# Patient Record
Sex: Female | Born: 1940 | Race: White | Hispanic: No | Marital: Married | Smoking: Former smoker
Health system: Southern US, Community
[De-identification: ages and names within clinical notes are randomized; demographics above are authoritative.]

## PROBLEM LIST (undated history)

## (undated) DIAGNOSIS — E785 Hyperlipidemia, unspecified: Secondary | ICD-10-CM

## (undated) DIAGNOSIS — B029 Zoster without complications: Secondary | ICD-10-CM

## (undated) DIAGNOSIS — IMO0002 Reserved for concepts with insufficient information to code with codable children: Secondary | ICD-10-CM

## (undated) DIAGNOSIS — E079 Disorder of thyroid, unspecified: Secondary | ICD-10-CM

## (undated) DIAGNOSIS — Z8619 Personal history of other infectious and parasitic diseases: Secondary | ICD-10-CM

## (undated) DIAGNOSIS — M329 Systemic lupus erythematosus, unspecified: Secondary | ICD-10-CM

## (undated) DIAGNOSIS — B019 Varicella without complication: Secondary | ICD-10-CM

## (undated) DIAGNOSIS — I639 Cerebral infarction, unspecified: Secondary | ICD-10-CM

## (undated) DIAGNOSIS — R918 Other nonspecific abnormal finding of lung field: Secondary | ICD-10-CM

## (undated) DIAGNOSIS — I10 Essential (primary) hypertension: Secondary | ICD-10-CM

## (undated) DIAGNOSIS — M1990 Unspecified osteoarthritis, unspecified site: Secondary | ICD-10-CM

## (undated) HISTORY — DX: Varicella without complication: B01.9

## (undated) HISTORY — DX: Reserved for concepts with insufficient information to code with codable children: IMO0002

## (undated) HISTORY — DX: Zoster without complications: B02.9

## (undated) HISTORY — DX: Hyperlipidemia, unspecified: E78.5

## (undated) HISTORY — PX: TONSILLECTOMY AND ADENOIDECTOMY: SUR1326

## (undated) HISTORY — DX: Essential (primary) hypertension: I10

## (undated) HISTORY — PX: OTHER SURGICAL HISTORY: SHX169

## (undated) HISTORY — DX: Personal history of other infectious and parasitic diseases: Z86.19

## (undated) HISTORY — DX: Cerebral infarction, unspecified: I63.9

## (undated) HISTORY — PX: ABDOMINAL HYSTERECTOMY: SHX81

## (undated) HISTORY — DX: Unspecified osteoarthritis, unspecified site: M19.90

## (undated) HISTORY — DX: Systemic lupus erythematosus, unspecified: M32.9

## (undated) HISTORY — DX: Disorder of thyroid, unspecified: E07.9

---

## 1997-08-31 ENCOUNTER — Ambulatory Visit (HOSPITAL_COMMUNITY): Admission: RE | Admit: 1997-08-31 | Discharge: 1997-08-31 | Payer: Self-pay | Admitting: Internal Medicine

## 1998-08-23 ENCOUNTER — Ambulatory Visit (HOSPITAL_COMMUNITY): Admission: RE | Admit: 1998-08-23 | Discharge: 1998-08-23 | Payer: Self-pay | Admitting: Internal Medicine

## 1999-09-10 ENCOUNTER — Ambulatory Visit (HOSPITAL_COMMUNITY): Admission: RE | Admit: 1999-09-10 | Discharge: 1999-09-10 | Payer: Self-pay | Admitting: Internal Medicine

## 1999-09-10 ENCOUNTER — Encounter: Payer: Self-pay | Admitting: Internal Medicine

## 1999-11-14 ENCOUNTER — Other Ambulatory Visit: Admission: RE | Admit: 1999-11-14 | Discharge: 1999-11-14 | Payer: Self-pay | Admitting: Internal Medicine

## 2000-09-10 ENCOUNTER — Encounter: Payer: Self-pay | Admitting: Internal Medicine

## 2000-09-10 ENCOUNTER — Ambulatory Visit (HOSPITAL_COMMUNITY): Admission: RE | Admit: 2000-09-10 | Discharge: 2000-09-10 | Payer: Self-pay | Admitting: Internal Medicine

## 2001-09-21 ENCOUNTER — Ambulatory Visit (HOSPITAL_COMMUNITY): Admission: RE | Admit: 2001-09-21 | Discharge: 2001-09-21 | Payer: Self-pay | Admitting: Internal Medicine

## 2001-09-21 ENCOUNTER — Encounter: Payer: Self-pay | Admitting: Internal Medicine

## 2002-01-13 ENCOUNTER — Other Ambulatory Visit: Admission: RE | Admit: 2002-01-13 | Discharge: 2002-01-13 | Payer: Self-pay | Admitting: Internal Medicine

## 2002-10-04 ENCOUNTER — Encounter: Payer: Self-pay | Admitting: Internal Medicine

## 2002-10-04 ENCOUNTER — Ambulatory Visit (HOSPITAL_COMMUNITY): Admission: RE | Admit: 2002-10-04 | Discharge: 2002-10-04 | Payer: Self-pay | Admitting: Internal Medicine

## 2003-11-15 ENCOUNTER — Ambulatory Visit (HOSPITAL_COMMUNITY): Admission: RE | Admit: 2003-11-15 | Discharge: 2003-11-15 | Payer: Self-pay | Admitting: Internal Medicine

## 2004-04-22 ENCOUNTER — Ambulatory Visit: Payer: Self-pay | Admitting: Internal Medicine

## 2004-07-31 ENCOUNTER — Ambulatory Visit: Payer: Self-pay | Admitting: Internal Medicine

## 2004-10-14 ENCOUNTER — Ambulatory Visit: Payer: Self-pay | Admitting: Internal Medicine

## 2004-12-04 ENCOUNTER — Ambulatory Visit (HOSPITAL_COMMUNITY): Admission: RE | Admit: 2004-12-04 | Discharge: 2004-12-04 | Payer: Self-pay | Admitting: Internal Medicine

## 2004-12-16 ENCOUNTER — Ambulatory Visit: Payer: Self-pay | Admitting: Internal Medicine

## 2005-06-19 ENCOUNTER — Encounter: Payer: Self-pay | Admitting: Internal Medicine

## 2005-06-19 ENCOUNTER — Other Ambulatory Visit: Admission: RE | Admit: 2005-06-19 | Discharge: 2005-06-19 | Payer: Self-pay | Admitting: Neurosurgery

## 2005-06-19 ENCOUNTER — Ambulatory Visit: Payer: Self-pay | Admitting: Internal Medicine

## 2005-07-09 ENCOUNTER — Encounter: Admission: RE | Admit: 2005-07-09 | Discharge: 2005-07-09 | Payer: Self-pay | Admitting: Internal Medicine

## 2005-07-22 ENCOUNTER — Ambulatory Visit: Payer: Self-pay | Admitting: Internal Medicine

## 2005-07-24 ENCOUNTER — Ambulatory Visit: Payer: Self-pay | Admitting: Internal Medicine

## 2005-12-04 ENCOUNTER — Ambulatory Visit: Payer: Self-pay | Admitting: Internal Medicine

## 2005-12-04 ENCOUNTER — Ambulatory Visit (HOSPITAL_COMMUNITY): Admission: RE | Admit: 2005-12-04 | Discharge: 2005-12-04 | Payer: Self-pay | Admitting: Internal Medicine

## 2006-01-29 ENCOUNTER — Ambulatory Visit: Payer: Self-pay | Admitting: Internal Medicine

## 2006-02-10 ENCOUNTER — Ambulatory Visit: Payer: Self-pay | Admitting: Internal Medicine

## 2006-03-02 ENCOUNTER — Ambulatory Visit: Payer: Self-pay | Admitting: Internal Medicine

## 2006-03-02 LAB — CONVERTED CEMR LAB
ALT: 17 units/L (ref 0–40)
AST: 23 units/L (ref 0–37)
Albumin: 3.5 g/dL (ref 3.5–5.2)
BUN: 10 mg/dL (ref 6–23)
Basophils Absolute: 0 10*3/uL (ref 0.0–0.1)
Basophils Relative: 0.7 % (ref 0.0–1.0)
GFR calc Af Amer: 107 mL/min
GFR calc non Af Amer: 89 mL/min
HCT: 38.2 % (ref 36.0–46.0)
Hemoglobin: 13.1 g/dL (ref 12.0–15.0)
MCHC: 34.4 g/dL (ref 30.0–36.0)
Monocytes Absolute: 0.5 10*3/uL (ref 0.2–0.7)
Monocytes Relative: 9.1 % (ref 3.0–11.0)
Potassium: 4.2 meq/L (ref 3.5–5.1)
RBC: 3.82 M/uL — ABNORMAL LOW (ref 3.87–5.11)
RDW: 13.8 % (ref 11.5–14.6)
Sodium: 137 meq/L (ref 135–145)
Total Bilirubin: 0.4 mg/dL (ref 0.3–1.2)
Total Protein: 6.6 g/dL (ref 6.0–8.3)

## 2006-06-11 ENCOUNTER — Ambulatory Visit: Payer: Self-pay | Admitting: Internal Medicine

## 2006-06-11 LAB — CONVERTED CEMR LAB
Albumin: 3.5 g/dL (ref 3.5–5.2)
Basophils Absolute: 0 10*3/uL (ref 0.0–0.1)
Basophils Relative: 0.9 % (ref 0.0–1.0)
Eosinophils Absolute: 0.1 10*3/uL (ref 0.0–0.6)
Hemoglobin: 13.4 g/dL (ref 12.0–15.0)
Lymphocytes Relative: 27.7 % (ref 12.0–46.0)
MCHC: 34.5 g/dL (ref 30.0–36.0)
MCV: 99.2 fL (ref 78.0–100.0)
Monocytes Absolute: 0.5 10*3/uL (ref 0.2–0.7)
Monocytes Relative: 9.5 % (ref 3.0–11.0)
Neutro Abs: 3.2 10*3/uL (ref 1.4–7.7)
Platelets: 132 10*3/uL — ABNORMAL LOW (ref 150–400)
Total Bilirubin: 0.8 mg/dL (ref 0.3–1.2)

## 2006-08-19 ENCOUNTER — Ambulatory Visit: Payer: Self-pay | Admitting: Internal Medicine

## 2006-08-20 ENCOUNTER — Telehealth: Payer: Self-pay | Admitting: *Deleted

## 2006-08-31 ENCOUNTER — Encounter: Payer: Self-pay | Admitting: Internal Medicine

## 2006-12-07 ENCOUNTER — Ambulatory Visit: Payer: Self-pay | Admitting: Internal Medicine

## 2006-12-07 ENCOUNTER — Telehealth: Payer: Self-pay | Admitting: *Deleted

## 2006-12-07 DIAGNOSIS — M069 Rheumatoid arthritis, unspecified: Secondary | ICD-10-CM | POA: Insufficient documentation

## 2006-12-07 DIAGNOSIS — R930 Abnormal findings on diagnostic imaging of skull and head, not elsewhere classified: Secondary | ICD-10-CM

## 2006-12-07 DIAGNOSIS — Z87898 Personal history of other specified conditions: Secondary | ICD-10-CM

## 2006-12-07 DIAGNOSIS — J449 Chronic obstructive pulmonary disease, unspecified: Secondary | ICD-10-CM

## 2006-12-07 DIAGNOSIS — M810 Age-related osteoporosis without current pathological fracture: Secondary | ICD-10-CM | POA: Insufficient documentation

## 2006-12-07 DIAGNOSIS — E039 Hypothyroidism, unspecified: Secondary | ICD-10-CM | POA: Insufficient documentation

## 2006-12-07 DIAGNOSIS — J4489 Other specified chronic obstructive pulmonary disease: Secondary | ICD-10-CM | POA: Insufficient documentation

## 2006-12-07 DIAGNOSIS — F329 Major depressive disorder, single episode, unspecified: Secondary | ICD-10-CM

## 2006-12-07 DIAGNOSIS — K219 Gastro-esophageal reflux disease without esophagitis: Secondary | ICD-10-CM

## 2006-12-09 ENCOUNTER — Ambulatory Visit: Payer: Self-pay | Admitting: Cardiology

## 2006-12-16 ENCOUNTER — Ambulatory Visit: Payer: Self-pay | Admitting: Internal Medicine

## 2007-01-04 ENCOUNTER — Ambulatory Visit: Payer: Self-pay | Admitting: Internal Medicine

## 2007-01-04 LAB — CONVERTED CEMR LAB
ALT: 21 units/L (ref 0–35)
AST: 22 units/L (ref 0–37)
Basophils Absolute: 0 10*3/uL (ref 0.0–0.1)
Bilirubin, Direct: 0.1 mg/dL (ref 0.0–0.3)
Eosinophils Relative: 1.3 % (ref 0.0–5.0)
HCT: 38.8 % (ref 36.0–46.0)
Hemoglobin: 13.4 g/dL (ref 12.0–15.0)
MCHC: 34.6 g/dL (ref 30.0–36.0)
MCV: 100.2 fL — ABNORMAL HIGH (ref 78.0–100.0)
Monocytes Absolute: 0.6 10*3/uL (ref 0.2–0.7)
Neutrophils Relative %: 55.3 % (ref 43.0–77.0)
RBC: 3.87 M/uL (ref 3.87–5.11)
RDW: 13.3 % (ref 11.5–14.6)
TSH: 3.09 microintl units/mL (ref 0.35–5.50)
Total Protein: 7 g/dL (ref 6.0–8.3)
WBC: 5.4 10*3/uL (ref 4.5–10.5)

## 2007-01-26 ENCOUNTER — Ambulatory Visit (HOSPITAL_COMMUNITY): Admission: RE | Admit: 2007-01-26 | Discharge: 2007-01-26 | Payer: Self-pay | Admitting: Internal Medicine

## 2007-03-03 ENCOUNTER — Ambulatory Visit: Payer: Self-pay | Admitting: Internal Medicine

## 2007-04-07 ENCOUNTER — Ambulatory Visit: Payer: Self-pay | Admitting: Internal Medicine

## 2007-04-07 LAB — CONVERTED CEMR LAB

## 2007-04-21 ENCOUNTER — Encounter: Payer: Self-pay | Admitting: Internal Medicine

## 2007-04-29 ENCOUNTER — Ambulatory Visit: Payer: Self-pay | Admitting: Internal Medicine

## 2007-04-29 LAB — CONVERTED CEMR LAB
ALT: 20 units/L (ref 0–35)
AST: 23 units/L (ref 0–37)
Albumin: 3.3 g/dL — ABNORMAL LOW (ref 3.5–5.2)
Alkaline Phosphatase: 92 units/L (ref 39–117)
Basophils Absolute: 0 10*3/uL (ref 0.0–0.1)
Basophils Relative: 0.7 % (ref 0.0–1.0)
Direct LDL: 147 mg/dL
Eosinophils Relative: 2 % (ref 0.0–5.0)
HDL: 38.7 mg/dL — ABNORMAL LOW (ref >39.0)
Hemoglobin: 12.4 g/dL (ref 12.0–15.0)
Lymphocytes Relative: 27.2 % (ref 12.0–46.0)
Monocytes Relative: 10.4 % (ref 3.0–11.0)
Neutro Abs: 2.6 10*3/uL (ref 1.4–7.7)
Neutrophils Relative %: 59.7 % (ref 43.0–77.0)
RBC: 3.7 M/uL — ABNORMAL LOW (ref 3.87–5.11)
Total CHOL/HDL Ratio: 5.5
VLDL: 32 mg/dL (ref 0–40)
WBC: 4.2 10*3/uL — ABNORMAL LOW (ref 4.5–10.5)

## 2007-06-01 ENCOUNTER — Ambulatory Visit: Payer: Self-pay | Admitting: Internal Medicine

## 2007-06-01 DIAGNOSIS — E785 Hyperlipidemia, unspecified: Secondary | ICD-10-CM

## 2007-06-01 DIAGNOSIS — L930 Discoid lupus erythematosus: Secondary | ICD-10-CM

## 2007-07-15 ENCOUNTER — Telehealth: Payer: Self-pay | Admitting: *Deleted

## 2007-08-16 ENCOUNTER — Ambulatory Visit: Payer: Self-pay | Admitting: Internal Medicine

## 2007-08-16 LAB — CONVERTED CEMR LAB
ALT: 18 units/L (ref 0–35)
Alkaline Phosphatase: 99 units/L (ref 39–117)
Bilirubin, Direct: 0.1 mg/dL (ref 0.0–0.3)
HDL: 33.7 mg/dL — ABNORMAL LOW (ref >39.0)
Total Bilirubin: 0.8 mg/dL (ref 0.3–1.2)

## 2007-08-23 ENCOUNTER — Ambulatory Visit: Payer: Self-pay | Admitting: Internal Medicine

## 2007-08-23 DIAGNOSIS — T887XXA Unspecified adverse effect of drug or medicament, initial encounter: Secondary | ICD-10-CM

## 2007-08-23 LAB — CONVERTED CEMR LAB
Albumin: 3.3 g/dL — ABNORMAL LOW (ref 3.5–5.2)
Basophils Absolute: 0.2 10*3/uL — ABNORMAL HIGH (ref 0.0–0.1)
Basophils Relative: 5.4 % — ABNORMAL HIGH (ref 0.0–3.0)
Eosinophils Absolute: 0.1 10*3/uL (ref 0.0–0.7)
Eosinophils Relative: 1.7 % (ref 0.0–5.0)
HCT: 37.2 % (ref 36.0–46.0)
MCHC: 33.6 g/dL (ref 30.0–36.0)
MCV: 103 fL — ABNORMAL HIGH (ref 78.0–100.0)
Monocytes Absolute: 0.5 10*3/uL (ref 0.1–1.0)
Platelets: 123 10*3/uL — ABNORMAL LOW (ref 150–400)
RBC: 3.61 M/uL — ABNORMAL LOW (ref 3.87–5.11)
Total Protein: 6 g/dL (ref 6.0–8.3)
WBC: 4.6 10*3/uL (ref 4.5–10.5)

## 2007-11-05 ENCOUNTER — Telehealth: Payer: Self-pay | Admitting: *Deleted

## 2007-11-10 ENCOUNTER — Ambulatory Visit: Payer: Self-pay | Admitting: Internal Medicine

## 2007-11-10 LAB — CONVERTED CEMR LAB
Basophils Absolute: 0 10*3/uL (ref 0.0–0.1)
Bilirubin, Direct: 0.1 mg/dL (ref 0.0–0.3)
Cholesterol: 214 mg/dL (ref 0–200)
Eosinophils Absolute: 0.1 10*3/uL (ref 0.0–0.7)
HCT: 38.3 % (ref 36.0–46.0)
HDL: 39.8 mg/dL (ref >39.0)
MCHC: 33.6 g/dL (ref 30.0–36.0)
MCV: 102.3 fL — ABNORMAL HIGH (ref 78.0–100.0)
Monocytes Absolute: 0.4 10*3/uL (ref 0.1–1.0)
Platelets: 140 10*3/uL — ABNORMAL LOW (ref 150–400)
RDW: 12.8 % (ref 11.5–14.6)
TSH: 3.1 microintl units/mL (ref 0.35–5.50)
Total Bilirubin: 0.7 mg/dL (ref 0.3–1.2)
Total Protein: 6.7 g/dL (ref 6.0–8.3)
Triglycerides: 121 mg/dL (ref 0–149)

## 2007-11-11 ENCOUNTER — Encounter: Payer: Self-pay | Admitting: Internal Medicine

## 2007-11-11 LAB — CONVERTED CEMR LAB: Vit D, 1,25-Dihydroxy: 52 (ref 30–89)

## 2008-02-15 ENCOUNTER — Telehealth: Payer: Self-pay | Admitting: Internal Medicine

## 2008-02-17 ENCOUNTER — Encounter: Payer: Self-pay | Admitting: Internal Medicine

## 2008-02-22 ENCOUNTER — Ambulatory Visit: Payer: Self-pay | Admitting: Internal Medicine

## 2008-02-22 DIAGNOSIS — M25519 Pain in unspecified shoulder: Secondary | ICD-10-CM | POA: Insufficient documentation

## 2008-02-22 LAB — CONVERTED CEMR LAB
Cholesterol, target level: 200 mg/dL
HDL goal, serum: 40 mg/dL
LDL Goal: 130 mg/dL

## 2008-03-01 ENCOUNTER — Ambulatory Visit: Payer: Self-pay | Admitting: Internal Medicine

## 2008-03-01 ENCOUNTER — Ambulatory Visit (HOSPITAL_COMMUNITY): Admission: RE | Admit: 2008-03-01 | Discharge: 2008-03-01 | Payer: Self-pay | Admitting: Internal Medicine

## 2008-03-01 ENCOUNTER — Encounter: Payer: Self-pay | Admitting: Internal Medicine

## 2008-05-29 ENCOUNTER — Ambulatory Visit: Payer: Self-pay | Admitting: Internal Medicine

## 2008-05-29 DIAGNOSIS — J301 Allergic rhinitis due to pollen: Secondary | ICD-10-CM | POA: Insufficient documentation

## 2008-05-29 LAB — CONVERTED CEMR LAB
Albumin: 3.1 g/dL — ABNORMAL LOW (ref 3.5–5.2)
Basophils Relative: 1.5 % (ref 0.0–3.0)
Bilirubin, Direct: 0.1 mg/dL (ref 0.0–0.3)
CO2: 30 meq/L (ref 19–32)
Calcium: 8.9 mg/dL (ref 8.4–10.5)
Creatinine, Ser: 0.8 mg/dL (ref 0.4–1.2)
Hemoglobin: 12.1 g/dL (ref 12.0–15.0)
Lymphocytes Relative: 28.6 % (ref 12.0–46.0)
MCHC: 34.8 g/dL (ref 30.0–36.0)
Monocytes Relative: 5 % (ref 3.0–12.0)
Neutro Abs: 2.7 10*3/uL (ref 1.4–7.7)
RBC: 3.4 M/uL — ABNORMAL LOW (ref 3.87–5.11)
Total Protein: 6.9 g/dL (ref 6.0–8.3)

## 2008-06-01 ENCOUNTER — Telehealth: Payer: Self-pay | Admitting: *Deleted

## 2008-06-02 ENCOUNTER — Telehealth: Payer: Self-pay | Admitting: Internal Medicine

## 2008-06-03 ENCOUNTER — Encounter: Payer: Self-pay | Admitting: Internal Medicine

## 2008-06-26 ENCOUNTER — Ambulatory Visit: Payer: Self-pay | Admitting: Family Medicine

## 2008-06-26 ENCOUNTER — Telehealth: Payer: Self-pay | Admitting: *Deleted

## 2008-08-14 ENCOUNTER — Ambulatory Visit: Payer: Self-pay | Admitting: Internal Medicine

## 2008-08-14 DIAGNOSIS — Q742 Other congenital malformations of lower limb(s), including pelvic girdle: Secondary | ICD-10-CM | POA: Insufficient documentation

## 2008-08-14 LAB — CONVERTED CEMR LAB
ALT: 22 units/L (ref 0–35)
AST: 25 units/L (ref 0–37)
Alkaline Phosphatase: 101 units/L (ref 39–117)
Basophils Absolute: 0 10*3/uL (ref 0.0–0.1)
Bilirubin, Direct: 0 mg/dL (ref 0.0–0.3)
Eosinophils Absolute: 0 10*3/uL (ref 0.0–0.7)
Free T4: 0.9 ng/dL (ref 0.6–1.6)
Lymphocytes Relative: 35.2 % (ref 12.0–46.0)
MCHC: 33.9 g/dL (ref 30.0–36.0)
Neutrophils Relative %: 53.2 % (ref 43.0–77.0)
RDW: 13.7 % (ref 11.5–14.6)
T3, Free: 2.5 pg/mL (ref 2.3–4.2)
Total Protein: 6.7 g/dL (ref 6.0–8.3)

## 2008-10-24 ENCOUNTER — Telehealth: Payer: Self-pay | Admitting: Internal Medicine

## 2008-10-26 ENCOUNTER — Telehealth: Payer: Self-pay | Admitting: Internal Medicine

## 2008-10-26 DIAGNOSIS — J984 Other disorders of lung: Secondary | ICD-10-CM

## 2008-11-15 ENCOUNTER — Ambulatory Visit: Payer: Self-pay | Admitting: Internal Medicine

## 2009-01-15 ENCOUNTER — Ambulatory Visit: Payer: Self-pay | Admitting: Internal Medicine

## 2009-01-16 LAB — CONVERTED CEMR LAB
ALT: 20 units/L (ref 0–35)
Albumin: 3 g/dL — ABNORMAL LOW (ref 3.5–5.2)
Direct LDL: 147.3 mg/dL
Eosinophils Relative: 2.8 % (ref 0.0–5.0)
HCT: 34.9 % — ABNORMAL LOW (ref 36.0–46.0)
HDL: 48.1 mg/dL (ref >39.00)
Hemoglobin: 11.5 g/dL — ABNORMAL LOW (ref 12.0–15.0)
Lymphs Abs: 1.2 10*3/uL (ref 0.7–4.0)
MCV: 105.1 fL — ABNORMAL HIGH (ref 78.0–100.0)
Monocytes Absolute: 0.2 10*3/uL (ref 0.1–1.0)
Neutro Abs: 2.8 10*3/uL (ref 1.4–7.7)
Platelets: 155 10*3/uL (ref 150.0–400.0)
RDW: 13 % (ref 11.5–14.6)
Total Bilirubin: 0.7 mg/dL (ref 0.3–1.2)
WBC: 4.3 10*3/uL — ABNORMAL LOW (ref 4.5–10.5)

## 2009-04-16 ENCOUNTER — Ambulatory Visit: Payer: Self-pay | Admitting: Internal Medicine

## 2009-04-18 ENCOUNTER — Telehealth (INDEPENDENT_AMBULATORY_CARE_PROVIDER_SITE_OTHER): Payer: Self-pay | Admitting: *Deleted

## 2009-06-11 ENCOUNTER — Telehealth: Payer: Self-pay | Admitting: Internal Medicine

## 2009-06-12 ENCOUNTER — Encounter: Payer: Self-pay | Admitting: Internal Medicine

## 2009-07-11 ENCOUNTER — Ambulatory Visit: Payer: Self-pay | Admitting: Internal Medicine

## 2009-07-11 DIAGNOSIS — F5102 Adjustment insomnia: Secondary | ICD-10-CM

## 2009-07-11 LAB — CONVERTED CEMR LAB
Basophils Absolute: 0 10*3/uL (ref 0.0–0.1)
Bilirubin, Direct: 0.1 mg/dL (ref 0.0–0.3)
Eosinophils Absolute: 0.1 10*3/uL (ref 0.0–0.7)
HCT: 36.6 % (ref 36.0–46.0)
Lymphs Abs: 1.6 10*3/uL (ref 0.7–4.0)
MCHC: 34.3 g/dL (ref 30.0–36.0)
MCV: 102.2 fL — ABNORMAL HIGH (ref 78.0–100.0)
Monocytes Absolute: 0.4 10*3/uL (ref 0.1–1.0)
Neutrophils Relative %: 52.8 % (ref 43.0–77.0)
Platelets: 113 10*3/uL — ABNORMAL LOW (ref 150.0–400.0)
RDW: 14.4 % (ref 11.5–14.6)
Total Bilirubin: 0.5 mg/dL (ref 0.3–1.2)
WBC: 4.4 10*3/uL — ABNORMAL LOW (ref 4.5–10.5)

## 2009-09-12 ENCOUNTER — Ambulatory Visit: Payer: Self-pay | Admitting: Internal Medicine

## 2009-09-12 ENCOUNTER — Telehealth: Payer: Self-pay | Admitting: Internal Medicine

## 2009-09-12 DIAGNOSIS — L2089 Other atopic dermatitis: Secondary | ICD-10-CM

## 2009-09-13 LAB — CONVERTED CEMR LAB
ALT: 17 units/L (ref 0–35)
Albumin: 3 g/dL — ABNORMAL LOW (ref 3.5–5.2)
Alkaline Phosphatase: 97 units/L (ref 39–117)
BUN: 10 mg/dL (ref 6–23)
Basophils Relative: 0.5 % (ref 0.0–3.0)
Bilirubin, Direct: 0.1 mg/dL (ref 0.0–0.3)
Calcium: 8.5 mg/dL (ref 8.4–10.5)
Eosinophils Absolute: 0.1 10*3/uL (ref 0.0–0.7)
Eosinophils Relative: 1.6 % (ref 0.0–5.0)
GFR calc non Af Amer: 92.09 mL/min (ref >60)
Glucose, Bld: 105 mg/dL — ABNORMAL HIGH (ref 70–99)
Hemoglobin: 12.1 g/dL (ref 12.0–15.0)
MCHC: 34.2 g/dL (ref 30.0–36.0)
MCV: 102.7 fL — ABNORMAL HIGH (ref 78.0–100.0)
Monocytes Absolute: 0.4 10*3/uL (ref 0.1–1.0)
Neutro Abs: 3.5 10*3/uL (ref 1.4–7.7)
Neutrophils Relative %: 63.6 % (ref 43.0–77.0)
Potassium: 4 meq/L (ref 3.5–5.1)
RBC: 3.43 M/uL — ABNORMAL LOW (ref 3.87–5.11)
Sodium: 139 meq/L (ref 135–145)
Total Protein: 5.9 g/dL — ABNORMAL LOW (ref 6.0–8.3)
WBC: 5.6 10*3/uL (ref 4.5–10.5)

## 2009-11-05 ENCOUNTER — Encounter: Payer: Self-pay | Admitting: Internal Medicine

## 2009-12-04 LAB — HM MAMMOGRAPHY

## 2009-12-06 ENCOUNTER — Encounter: Payer: Self-pay | Admitting: Internal Medicine

## 2010-01-02 ENCOUNTER — Ambulatory Visit (HOSPITAL_COMMUNITY)
Admission: RE | Admit: 2010-01-02 | Discharge: 2010-01-02 | Payer: Self-pay | Source: Home / Self Care | Admitting: Internal Medicine

## 2010-01-23 ENCOUNTER — Encounter: Payer: Self-pay | Admitting: Internal Medicine

## 2010-02-03 HISTORY — PX: OTHER SURGICAL HISTORY: SHX169

## 2010-02-07 ENCOUNTER — Ambulatory Visit
Admission: RE | Admit: 2010-02-07 | Discharge: 2010-02-07 | Payer: Self-pay | Source: Home / Self Care | Attending: Internal Medicine | Admitting: Internal Medicine

## 2010-02-07 ENCOUNTER — Telehealth: Payer: Self-pay | Admitting: Internal Medicine

## 2010-02-07 DIAGNOSIS — L97909 Non-pressure chronic ulcer of unspecified part of unspecified lower leg with unspecified severity: Secondary | ICD-10-CM | POA: Insufficient documentation

## 2010-02-11 ENCOUNTER — Telehealth: Payer: Self-pay | Admitting: Internal Medicine

## 2010-02-13 ENCOUNTER — Encounter (HOSPITAL_BASED_OUTPATIENT_CLINIC_OR_DEPARTMENT_OTHER)
Admission: RE | Admit: 2010-02-13 | Discharge: 2010-03-05 | Payer: Self-pay | Source: Home / Self Care | Attending: General Surgery | Admitting: General Surgery

## 2010-02-24 ENCOUNTER — Encounter: Payer: Self-pay | Admitting: Internal Medicine

## 2010-02-25 ENCOUNTER — Inpatient Hospital Stay (HOSPITAL_COMMUNITY)
Admission: EM | Admit: 2010-02-25 | Discharge: 2010-03-07 | DRG: 336 | Disposition: A | Discharge status: Home / Self Care | Payer: Medicare Other | Attending: Emergency Medicine | Admitting: Emergency Medicine

## 2010-02-25 DIAGNOSIS — F3289 Other specified depressive episodes: Secondary | ICD-10-CM | POA: Diagnosis present

## 2010-02-25 DIAGNOSIS — D72829 Elevated white blood cell count, unspecified: Secondary | ICD-10-CM | POA: Diagnosis present

## 2010-02-25 DIAGNOSIS — J449 Chronic obstructive pulmonary disease, unspecified: Secondary | ICD-10-CM | POA: Diagnosis present

## 2010-02-25 DIAGNOSIS — E785 Hyperlipidemia, unspecified: Secondary | ICD-10-CM | POA: Diagnosis present

## 2010-02-25 DIAGNOSIS — J4489 Other specified chronic obstructive pulmonary disease: Secondary | ICD-10-CM | POA: Diagnosis present

## 2010-02-25 DIAGNOSIS — M069 Rheumatoid arthritis, unspecified: Secondary | ICD-10-CM | POA: Diagnosis present

## 2010-02-25 DIAGNOSIS — F329 Major depressive disorder, single episode, unspecified: Secondary | ICD-10-CM | POA: Diagnosis present

## 2010-02-25 DIAGNOSIS — T394X5A Adverse effect of antirheumatics, not elsewhere classified, initial encounter: Secondary | ICD-10-CM | POA: Diagnosis present

## 2010-02-25 DIAGNOSIS — Q809 Congenital ichthyosis, unspecified: Secondary | ICD-10-CM

## 2010-02-25 DIAGNOSIS — L97909 Non-pressure chronic ulcer of unspecified part of unspecified lower leg with unspecified severity: Secondary | ICD-10-CM | POA: Diagnosis present

## 2010-02-25 DIAGNOSIS — K560 Paralytic ileus: Secondary | ICD-10-CM | POA: Diagnosis not present

## 2010-02-25 DIAGNOSIS — K565 Intestinal adhesions [bands], unspecified as to partial versus complete obstruction: Principal | ICD-10-CM | POA: Diagnosis present

## 2010-02-25 DIAGNOSIS — R188 Other ascites: Secondary | ICD-10-CM | POA: Diagnosis present

## 2010-02-25 DIAGNOSIS — K219 Gastro-esophageal reflux disease without esophagitis: Secondary | ICD-10-CM | POA: Diagnosis present

## 2010-02-25 DIAGNOSIS — E039 Hypothyroidism, unspecified: Secondary | ICD-10-CM | POA: Diagnosis present

## 2010-02-26 LAB — DIFFERENTIAL
Basophils Relative: 0 % (ref 0–1)
Lymphocytes Relative: 9 % — ABNORMAL LOW (ref 12–46)
Monocytes Absolute: 0.5 10*3/uL (ref 0.1–1.0)
Monocytes Relative: 5 % (ref 3–12)
Neutro Abs: 10.3 10*3/uL — ABNORMAL HIGH (ref 1.7–7.7)
Neutrophils Relative %: 86 % — ABNORMAL HIGH (ref 43–77)

## 2010-02-26 LAB — URINALYSIS, ROUTINE W REFLEX MICROSCOPIC
Hgb urine dipstick: NEGATIVE
Specific Gravity, Urine: 1.017 (ref 1.005–1.030)
Urine Glucose, Fasting: NEGATIVE mg/dL
Urobilinogen, UA: 1 mg/dL (ref 0.0–1.0)
pH: 6 (ref 5.0–8.0)

## 2010-02-26 LAB — CBC
Hemoglobin: 15 g/dL (ref 12.0–15.0)
MCH: 34 pg (ref 26.0–34.0)
MCHC: 34.7 g/dL (ref 30.0–36.0)
RBC: 4.41 MIL/uL (ref 3.87–5.11)
RDW: 13.2 % (ref 11.5–15.5)
WBC: 12 10*3/uL — ABNORMAL HIGH (ref 4.0–10.5)

## 2010-02-26 LAB — COMPREHENSIVE METABOLIC PANEL
ALT: 16 U/L (ref 0–35)
AST: 28 U/L (ref 0–37)
Albumin: 2.8 g/dL — ABNORMAL LOW (ref 3.5–5.2)
Alkaline Phosphatase: 86 U/L (ref 39–117)
Creatinine, Ser: 0.93 mg/dL (ref 0.4–1.2)
Glucose, Bld: 118 mg/dL — ABNORMAL HIGH (ref 70–99)
Potassium: 4.1 mEq/L (ref 3.5–5.1)
Sodium: 135 mEq/L (ref 135–145)
Total Bilirubin: 0.8 mg/dL (ref 0.3–1.2)

## 2010-02-27 LAB — CBC
HCT: 36.8 % (ref 36.0–46.0)
Hemoglobin: 12.4 g/dL (ref 12.0–15.0)
MCV: 98.5 fL (ref 78.0–100.0)
MCV: 100.3 fL — ABNORMAL HIGH (ref 78.0–100.0)
Platelets: 164 10*3/uL (ref 150–400)
RBC: 3.89 MIL/uL (ref 3.87–5.11)
RBC: 3.67 MIL/uL — ABNORMAL LOW (ref 3.87–5.11)
WBC: 9.8 10*3/uL (ref 4.0–10.5)
WBC: 5.1 10*3/uL (ref 4.0–10.5)

## 2010-02-27 LAB — DIFFERENTIAL
Eosinophils Relative: 1 % (ref 0–5)
Lymphocytes Relative: 22 % (ref 12–46)
Lymphs Abs: 1.1 10*3/uL (ref 0.7–4.0)
Neutrophils Relative %: 68 % (ref 43–77)

## 2010-02-27 LAB — COMPREHENSIVE METABOLIC PANEL
Alkaline Phosphatase: 68 U/L (ref 39–117)
BUN: 20 mg/dL (ref 6–23)
CO2: 25 mEq/L (ref 19–32)
Chloride: 108 mEq/L (ref 96–112)
Glucose, Bld: 68 mg/dL — ABNORMAL LOW (ref 70–99)
Potassium: 3.9 mEq/L (ref 3.5–5.1)
Total Bilirubin: 0.9 mg/dL (ref 0.3–1.2)

## 2010-02-27 LAB — BASIC METABOLIC PANEL
BUN: 21 mg/dL (ref 6–23)
Chloride: 100 mEq/L (ref 96–112)
Potassium: 4 mEq/L (ref 3.5–5.1)

## 2010-02-27 LAB — MAGNESIUM: Magnesium: 1.8 mg/dL (ref 1.5–2.5)

## 2010-02-27 LAB — TSH: TSH: 6.955 u[IU]/mL — ABNORMAL HIGH (ref 0.350–4.500)

## 2010-02-28 LAB — PHOSPHORUS: Phosphorus: 2.5 mg/dL (ref 2.3–4.6)

## 2010-02-28 LAB — DIFFERENTIAL
Basophils Absolute: 0 10*3/uL (ref 0.0–0.1)
Basophils Relative: 1 % (ref 0–1)
Eosinophils Relative: 2 % (ref 0–5)
Lymphocytes Relative: 26 % (ref 12–46)

## 2010-02-28 LAB — CBC
HCT: 34.3 % — ABNORMAL LOW (ref 36.0–46.0)
MCHC: 32.4 g/dL (ref 30.0–36.0)
Platelets: 150 10*3/uL (ref 150–400)
RDW: 13.8 % (ref 11.5–15.5)
WBC: 3.1 10*3/uL — ABNORMAL LOW (ref 4.0–10.5)

## 2010-02-28 LAB — PROTIME-INR: Prothrombin Time: 15.8 seconds — ABNORMAL HIGH (ref 11.6–15.2)

## 2010-02-28 LAB — COMPREHENSIVE METABOLIC PANEL
Alkaline Phosphatase: 67 U/L (ref 39–117)
BUN: 18 mg/dL (ref 6–23)
CO2: 21 mEq/L (ref 19–32)
Calcium: 8 mg/dL — ABNORMAL LOW (ref 8.4–10.5)
GFR calc non Af Amer: >60 mL/min (ref >60)
Glucose, Bld: 60 mg/dL — ABNORMAL LOW (ref 70–99)
Potassium: 3.6 mEq/L (ref 3.5–5.1)
Total Protein: 5.3 g/dL — ABNORMAL LOW (ref 6.0–8.3)

## 2010-02-28 LAB — T3, FREE: T3, Free: 2.1 pg/mL — ABNORMAL LOW (ref 2.3–4.2)

## 2010-02-28 LAB — GLUCOSE, CAPILLARY: Glucose-Capillary: 115 mg/dL — ABNORMAL HIGH (ref 70–99)

## 2010-02-28 LAB — MAGNESIUM: Magnesium: 1.9 mg/dL (ref 1.5–2.5)

## 2010-02-28 LAB — T4, FREE: Free T4: 1.08 ng/dL (ref 0.80–1.80)

## 2010-03-01 LAB — DIFFERENTIAL
Basophils Absolute: 0 10*3/uL (ref 0.0–0.1)
Basophils Relative: 1 % (ref 0–1)
Eosinophils Absolute: 0 10*3/uL (ref 0.0–0.7)
Eosinophils Relative: 1 % (ref 0–5)
Lymphocytes Relative: 23 % (ref 12–46)
Monocytes Absolute: 0.4 10*3/uL (ref 0.1–1.0)

## 2010-03-01 LAB — CBC
HCT: 35 % — ABNORMAL LOW (ref 36.0–46.0)
Hemoglobin: 11.8 g/dL — ABNORMAL LOW (ref 12.0–15.0)
MCH: 33.1 pg (ref 26.0–34.0)
MCHC: 33.7 g/dL (ref 30.0–36.0)
MCV: 98.3 fL (ref 78.0–100.0)
Platelets: 156 10*3/uL (ref 150–400)
RBC: 3.56 MIL/uL — ABNORMAL LOW (ref 3.87–5.11)
RDW: 13.4 % (ref 11.5–15.5)
WBC: 3.6 10*3/uL — ABNORMAL LOW (ref 4.0–10.5)

## 2010-03-01 LAB — COMPREHENSIVE METABOLIC PANEL
ALT: 20 U/L (ref 0–35)
Albumin: 2 g/dL — ABNORMAL LOW (ref 3.5–5.2)
Alkaline Phosphatase: 58 U/L (ref 39–117)
Calcium: 7.3 mg/dL — ABNORMAL LOW (ref 8.4–10.5)
GFR calc Af Amer: >60 mL/min (ref >60)
Glucose, Bld: 105 mg/dL — ABNORMAL HIGH (ref 70–99)
Potassium: 3.1 mEq/L — ABNORMAL LOW (ref 3.5–5.1)
Sodium: 139 mEq/L (ref 135–145)
Total Protein: 4.4 g/dL — ABNORMAL LOW (ref 6.0–8.3)

## 2010-03-01 LAB — GLUCOSE, CAPILLARY: Glucose-Capillary: 129 mg/dL — ABNORMAL HIGH (ref 70–99)

## 2010-03-01 LAB — MAGNESIUM: Magnesium: 1.5 mg/dL (ref 1.5–2.5)

## 2010-03-01 LAB — PHOSPHORUS: Phosphorus: 2.4 mg/dL (ref 2.3–4.6)

## 2010-03-02 LAB — BASIC METABOLIC PANEL
BUN: 10 mg/dL (ref 6–23)
Chloride: 114 mEq/L — ABNORMAL HIGH (ref 96–112)
Creatinine, Ser: 0.64 mg/dL (ref 0.4–1.2)
GFR calc Af Amer: >60 mL/min (ref >60)
GFR calc non Af Amer: >60 mL/min (ref >60)

## 2010-03-04 NOTE — Consult Note (Signed)
Kristen Lee, Kristen Lee           ACCOUNT NO.:  [id number]  MEDICAL RECORD NO.:  [id number]          PATIENT TYPE:  INP  LOCATION:  1508                         FACILITY:  [facility]  PHYSICIAN:  Juanetta Gosling, MDDATE OF BIRTH:  1940/03/22  DATE OF CONSULTATION:  02/27/2010 DATE OF DISCHARGE:                                CONSULTATION   TIME SEEN:  1450 hours.  CONSULTATION REQUESTED BY:  Rock Nephew, MD  CONSULTING SURGEON:  Troy Sine. Dwain Sarna, MD  PRIMARY CARE PHYSICIAN:  Stacie Glaze, MD.  REASON FOR CONSULTATION:  Small bowel obstruction.  HISTORY OF PRESENT ILLNESS:  Kristen Lee is a very pleasant 70 year old white female with a history of rheumatoid arthritis, Sjogren's syndrome, left leg wound, COPD, who developed abdominal pain on Saturday.  She states at this time, she had not been able to move her bowels for quite some time.  She subsequently developed significant nausea as well as emesis.  She does not remember when her last bowel movement was.  She does state that she has been passing very little flatus over the last several days.  Due to continued nausea and vomiting as well as worsening abdominal pain, she presented to the ER on Monday morning.  At that time, she had a further workup with a CT scan, which revealed a moderate partial small bowel obstruction.  At this time, the patient was admitted and an NG tube was placed.  The patient has had bowel rest since this time.  She has had abdominal x-rays each morning showing little change in her partial small bowel obstruction.  Because of this, we were asked to evaluate the patient for possible surgical intervention.  REVIEW OF SYSTEMS:  Please see HPI.  Otherwise, all other systems have been reviewed and are negative.  FAMILY HISTORY:  Noncontributory.  PAST MEDICAL HISTORY: 1. Rheumatoid arthritis. 2. Sjogren's syndrome. 3. 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 4. COPD. 5. Depression. 6. GERD. 7. Hyperlipidemia.  PAST SURGICAL HISTORY: 1. Partial hysterectomy. 2. Tonsillectomy.  SOCIAL HISTORY:  The patient is married and has two children.  She denies any alcohol or tobacco.  ALLERGIES:  PENICILLIN.  MEDICATIONS:  Medications at home include: 1. Doxycycline 100 mg p.o. b.i.d. 2. Folic acid 800 mcg daily. 3. Os-Cal. 4. Lovaza 1 gram 2 capsules b.i.d. 5. Tramadol 50 mg q.6 h p.r.n. pain. 6. Effexor 75 mg at bedtime. 7. Premarin 0.625 mg daily. 8. Detrol LA 4 mg p.o. b.i.d. 9. Levothyroxine 50 mcg daily.  PHYSICAL EXAMINATION:  GENERAL:  Kristen Lee is a very pleasant 70 year old white female who is currently laying in bed in no acute distress. VITAL SIGNS:  Temperature 98, pulse 92, blood pressure 149/89, respirations 18. HEENT:  Head is normocephalic, atraumatic.  Sclerae noninjected.  Pupils are equal, round and reactive to light.  Ears and nose without any obvious masses or lesions.  No rhinorrhea.  However, the patient does have an NG tube in the left naris.  Mouth is pink. HEART:  Regular rate and rhythm.  Normal S1-S2.  No murmurs, gallops or  rubs are noted.  She has palpable carotid, radial and pedal pulses bilaterally. LUNGS:  Clear to auscultation bilaterally with no wheezes, rhonchi or rales noted.  Respiratory effort is nonlabored. ABDOMEN:  Abdomen is soft with mild mid right abdominal tenderness.  She has minimal distention as well as a few active bowel sounds.  No masses, hernias or organomegaly are noted. MUSCULOSKELETAL:  All four extremities appear symmetrical.  However, she does have significant lower extremity deformities secondary to rheumatoid arthritis.  She also has a left leg wound that is currently dressed and this was not undressed to evaluate during exam. NEUROLOGIC:  Cranial nerves II-XII appear to be grossly intact.  Deep tendon reflex exam was deferred at this time. PSYCHIATRIC:  The  patient is alert and oriented x3 with an appropriate affect.  PERTINENT LABORATORY AND X-RAY DATA:  Sodium 140, potassium 3.9, glucose 68, BUN 20, creatinine 0.73, total bilirubin 0.9, alkaline phosphatase 68, AST 22, ALT 13, white blood cell count 5100, hemoglobin 12.4, hematocrit 36.8, platelet count is 150,000.  CT scan on admission reveals a moderate grade small bowel obstruction with a transition point at the level of the distal small bowel along with a moderate amount of abdominal and pelvic ascites.  Repeat abdominal films this morning reveal a stable partial small bowel obstruction with minimal progression.  IMPRESSION: 1. Partial small bowel obstruction. 2. Rheumatoid arthritis. 3. Chronic left leg wound. 4. Sjogren's syndrome. 5. Chronic obstructive pulmonary disease.  PLAN:  We agree with the prior management of conservative management with an NG tube and bowel rest.  However, the patient has had approximately 2-3 days of bowel rest with minimal progression in her x- rays.  If the patient's partial small bowel obstruction does not begin to improve, then the patient may require surgical intervention to correct this obstruction.  I have discussed this with the patient, her friend, and her granddaughter, who are present in the room.  Dr. Dwain Sarna is currently speaking with the patient and her husband is present in the room as well.  We will repeat abdominal films in the morning and make further decisions for her care at that time.     Letha Cape, PA   ______________________________ Juanetta Gosling, MD    KEO/MEDQ  D:  02/27/2010  T:  02/27/2010  Job:  [id number]  cc:   Stacie Glaze, MD [address] [place] [address]  Rock Nephew, MD  Electronically Signed by Barnetta Chapel PA on 03/04/2010 01:31:34 PM Electronically Signed by Emelia Loron MD on 03/04/2010 04:27:14 PM

## 2010-03-04 NOTE — Op Note (Signed)
  [name], [name]           ACCOUNT NO.:  [id number]  MEDICAL RECORD NO.:  [id number]          PATIENT TYPE:  INP  LOCATION:  1508                         FACILITY:  [facility]  PHYSICIAN:  Kristen Lee, MDDATE OF BIRTH:  06/03/1940  DATE OF PROCEDURE:  02/28/2010 DATE OF DISCHARGE:                              OPERATIVE REPORT   PREOPERATIVE DIAGNOSIS:  Small bowel obstruction.  POSTOPERATIVE DIAGNOSIS:  Small bowel obstruction.  PROCEDURE:  Exploratory laparotomy with lysis of adhesions.  SURGEON:  Emelia Loron, M.D.  ASSISTANT:  None.  ANESTHESIA:  General.  SPECIMENS:  None.  DRAINS:  None.  COMPLICATIONS:  None.  ESTIMATED BLOOD LOSS:  Minimal.  DISPOSITION:  To recovery room in stable condition.  INDICATIONS:  This is a 70 year old female with multiple medical problems who was admitted by the hospitalist service on February 25, 2010, with a small bowel obstruction.  They treated her conservatively and called [place] on February 27, 2010 when she was not improving.  I examined her and we decided to give her overnight to see if she would resolve.  She did not.  I discussed on the morning of February 28, 2010, to take her to the operating room for a laparotomy.  We went over the risks and benefits associated with the procedure prior to beginning.  DESCRIPTION OF PROCEDURE:  After informed consent was obtained, the patient was taken to the operating room.  She was administered 4 mg of IV ciprofloxacin due to penicillin allergy.  Sequential compression devices were placed on her lower extremities prior to induction of anesthesia.  She was then placed under general endotracheal anesthesia without complication.  Her abdomen was prepped and draped in standard sterile surgical fashion.  A surgical time-out was then performed.  Midline incision was then made, carried down to her peritoneum.  This was entered under direct vision without complication.  She was  noted to have a fair amount of fluid present in her abdomen and this was evacuated, this was about 250 mL.  Her bowel was noted to be dilated all the way down to her terminal ileum.  There were not a lot of adhesions present.  There were 2 bands present from her prior hysterectomy it appeared that I lysed with Metzenbaum scissors and this relieved her bowel obstruction.  I then ran the bowel 3 times from terminal ileum to ligament of Treitz.  There were no serosal tears.  The remainder of the bowel was viable.  The NG tube was in good position.  I then irrigated copiously, then closed her with #1 looped PDS.  I then irrigated the wound and closed this with staples and sterile dressing was placed.  She tolerated this well, was extubated in the operating room and transferred to recovery room in stable condition.     Kristen Gosling, MD     MCW/MEDQ  D:  02/28/2010  T:  02/28/2010  Job:  [id number]  cc:   Stacie Glaze, MD [address] [place] [address]  Louann Liv, M.D.  Electronically Signed by Emelia Loron MD on 03/04/2010 11:31:05 AM

## 2010-03-07 ENCOUNTER — Encounter (HOSPITAL_BASED_OUTPATIENT_CLINIC_OR_DEPARTMENT_OTHER): Payer: MEDICARE | Attending: Internal Medicine

## 2010-03-07 NOTE — Progress Notes (Signed)
Summary: [facility] called re: compound script  Phone Note From Pharmacy   Caller: [facility]   - Heather Summary of Call: Pts script was called in to [facility], but med could not be compounded. Pt req this med be sent to [facility]  for Betamethasone and Sarna.  Pls call in call to [phone] or fax to [phone]     Initial call taken by: Lucy Antigua,  September 12, 2009 3:42 PM    Prescriptions: BETAMETHASONE DIPROPIONATE AUG 0.05 % LOTN (BETAMETHASONE DIPROPIONATE AUG) add 30% bethamethasone to 70% sarna lotion and aplly to rash BID  #30gm x 1   Entered by:   Willy Eddy, LPN   Authorized by:   Stacie Glaze MD   Signed by:   Willy Eddy, LPN on 30/86/5784   Method used:   Telephoned to ...       [facility] (retail)       [address]       [place], [place]  [address]       Ph: [phone]       Fax: [phone]   RxID:   [id number]

## 2010-03-07 NOTE — Assessment & Plan Note (Signed)
Summary: 2 month rov/njr   Vital Signs:  Patient profile:   70 year old female Height:      63 inches Weight:      138 pounds BMI:     24.53 Temp:     98.2 degrees F oral Pulse rate:   76 / minute Resp:     14 per minute BP sitting:   136 / 80  (left arm)  Vitals Entered By: Willy Eddy, LPN (September 12, 2009 1:31 PM) CC: roa- c/o rash on left ankle with itching for 2 weeks Is Patient Diabetic? No   CC:  roa- c/o rash on left ankle with itching for 2 weeks.  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) 5)  Pain in Joint, Shoulder Region  (ICD-719.41) 6)  Uns Advrs Eff Uns Rx Medicinal&biological Sbstnc  (ICD-995.20) 7)  Lupus Erythematosus, 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 Once Daily 2)  Premarin 0.625 Mg  Tabs (Estrogens Conjugated) .... Once Daily 3)  Levothyroxine Sodium 50 Mcg  Tabs (Levothyroxine Sodium) .... Once Daily 4)  Oscal 500/200 D-3 500-200 Mg-Unit  Tabs  (Calcium-Vitamin D) .... Once Daily 5)  Arava 20 Mg  Tabs (Leflunomide) .... Once Daily 6)  Effexor Xr 75 Mg Xr24h-Cap (Venlafaxine Hcl) .Marland Kitchen.. 1 Once Daily 7)  Foltrin   Caps (Fe Fumarate-B12-Vit C-Fa-Ifc) .... Once Daily 8)  Detrol La 4 Mg  Cp24 (Tolterodine Tartrate) .... Two Times A Day 9)  Lovaza 1 Gm  Caps (Omega-3-Acid Ethyl Esters) .... Two By Mouth  Two Times A Day  ( Branded Fish Oil) 10)  Ultram 50 Mg  Tabs (Tramadol Hcl) .... One By Mouth Every 4 Hours 11)  Enbrel Sureclick 50 Mg/ml Soln (Etanercept) .... One Prefilled Injection Weekly 12)  Advair Diskus 100-50 Mcg/dose Aepb (Fluticasone-Salmeterol) .... One Puff By Mouth Two Times A Day 13)  Silenor 6 Mg Tabs (Doxepin Hcl) .... One By Mouth  At Bed Time  Allergies (verified): No Known Drug Allergies  Past History:  Family History: Last updated: 12/07/2006 Family History of CAD Female 1st degree relative <50  Social History: Last updated: 12/07/2006 Retired Married Former Smoker  Risk Factors: Smoking Status: quit (09/12/2009) Passive Smoke Exposure: no (09/12/2009)  Past medical, surgical, family and social histories (including risk factors) reviewed, and no changes noted (except as noted below).  Past Medical History: Reviewed history from 06/01/2007 and no changes required. Osteoporosis Rheumatoid arthritis  Hypothyroidism GERD Depression COPD cardiolyte 2001 Hyperlipidemia  Past Surgical History: Reviewed history from 12/07/2006 and no changes required. Hysterectomy Tonsillectomy foot surgery  Family History: Reviewed history from 12/07/2006 and no changes required. Family History of CAD Female 1st degree relative <50  Social History: Reviewed history from 12/07/2006 and no changes required. Retired Married Former Smoker  Review of Systems       The patient complains of hoarseness, suspicious skin lesions, and difficulty walking.  The patient denies anorexia, fever, weight loss, weight gain,  vision loss, decreased hearing, chest pain, syncope, dyspnea on exertion, peripheral edema, prolonged cough, headaches, hemoptysis, abdominal pain, melena, hematochezia, severe indigestion/heartburn, hematuria, incontinence, genital sores, muscle weakness, transient blindness, depression, unusual weight change, abnormal bleeding, enlarged lymph nodes, angioedema, and breast masses.    Physical Exam  General:  alert and pale.   Head:  normocephalic and no abnormalities observed.   Eyes:  pupils equal and pupils round.   Ears:  R ear normal and L ear normal.   Mouth:  Oral mucosa and oropharynx without lesions or exudates.  Teeth in good repair. Neck:  full ROM and no masses.   Lungs:  normal respiratory effort.   decreased at bases Heart:  normal rate and regular rhythm.   Abdomen:  soft and non-tender.   Skin:  maculopapular rash.  on leftr leg Cervical Nodes:  No lymphadenopathy noted Axillary Nodes:  No palpable lymphadenopathy Psych:  Oriented X3 and not depressed appearing.     Impression & Recommendations:  Problem # 1:  DERMATITIS, ATOPIC (ICD-691.8) Assessment New  Discussed use of medication and avoidance of irritating agents. Also stressed importance of moisturizers.   Her updated medication list for this problem includes:    Betamethasone Dipropionate Aug 0.05 % Lotn (Betamethasone dipropionate aug) .Marland Kitchen... Add 30% bethamethasone to 70% sarna lotion and aplly to rash bid  Problem # 2:  UNS ADVRS EFF UNS RX MEDICINAL&BIOLOGICAL SBSTNC (ICD-995.20) Assessment: Unchanged on embrel needs monitering of liver cbc and renal Orders: TLB-CBC Platelet - w/Differential (85025-CBCD) TLB-Hepatic/Liver Function Pnl (80076-HEPATIC) TLB-BMP (Basic Metabolic Panel-BMET) (80048-METABOL) Venipuncture (09811)  Problem # 3:  COPD (ICD-496) stable Her updated medication list for this problem includes:    Advair Diskus 100-50 Mcg/dose Aepb (Fluticasone-salmeterol) ..... One puff by mouth  two times a day  Vaccines Reviewed: Pneumovax: Historical (11/04/2002)   Flu Vax: Fluvax 3+ (11/10/2007)  Complete Medication List: 1)  Folic Acid 20 Mg Caps (Folic acid) .... Once daily 2)  Premarin 0.625 Mg Tabs (Estrogens conjugated) .... Once daily 3)  Levothyroxine Sodium 50 Mcg Tabs (Levothyroxine sodium) .... Once daily 4)  Oscal 500/200 D-3 500-200 Mg-unit Tabs (Calcium-vitamin d) .... Once daily 5)  Arava 20 Mg Tabs (Leflunomide) .... Once daily 6)  Effexor Xr 75 Mg Xr24h-cap (Venlafaxine hcl) .Marland Kitchen.. 1 once daily 7)  Foltrin Caps (Fe fumarate-b12-vit c-fa-ifc) .... Once daily 8)  Detrol La 4 Mg Cp24 (Tolterodine tartrate) .... Two times a day 9)  Lovaza 1 Gm Caps (Omega-3-acid ethyl esters) .... Two by mouth  two times a day  ( branded fish oil) 10)  Ultram 50 Mg Tabs (Tramadol hcl) .... One by mouth every 4 hours 11)  Enbrel Sureclick 50 Mg/ml Soln (Etanercept) .... One prefilled injection weekly 12)  Advair Diskus 100-50 Mcg/dose Aepb (Fluticasone-salmeterol) .... One puff by mouth two times a day 13)  Silenor 6 Mg Tabs (Doxepin hcl) .... One by mouth  at bed time 14)  Betamethasone Dipropionate Aug 0.05 % Lotn (  Betamethasone dipropionate aug) .... Add 30% bethamethasone to 70% sarna lotion and aplly to rash bid  Other Orders: TLB-TSH (Thyroid Stimulating Hormone) (84443-TSH) the  Patient Instructions: 1)  Please schedule a follow-up appointment in 2 months. Prescriptions: BETAMETHASONE DIPROPIONATE AUG 0.05 % LOTN (BETAMETHASONE DIPROPIONATE AUG) add 30% bethamethasone to 70% sarna lotion and aplly to rash BID  #30gm x 1   Entered and Authorized by:   Stacie Glaze MD   Signed by:   Stacie Glaze MD on 09/12/2009   Method used:   Electronically to        [facility] [phone]* (retail)       [address]       [place], [place]  [address]       Ph: [phone] or [phone]       Fax: [phone]   RxID:   [id number]   Appended  Document: Orders Update    Clinical Lists Changes  Orders: Added new Service order of Specimen Handling ([id number]) - Signed

## 2010-03-07 NOTE — Medication Information (Signed)
Summary: Coverage Approval for Lovaza  Coverage Approval for Lovaza   Imported By: Maryln Gottron 06/18/2009 13:55:18  _____________________________________________________________________  External Attachment:    Type:   Image     Comment:   External Document

## 2010-03-07 NOTE — Progress Notes (Signed)
Summary: Returning Call to Nurse  Phone Note Call from Patient Call back at Home Phone [phone]   Caller: Patient Summary of Call: Pt states she had a missed call from Dr. Ilda Foil nurse.  Please call me back. Initial call taken by: Trixie Dredge,  February 07, 2010 2:46 PM  Follow-up for Phone Call        left message to call back Follow-up by: Kyung Rudd, CMA,  February 08, 2010 8:41 AM  Additional Follow-up for Phone Call Additional follow up Details #1::        Pt states the wound is extremely painful, and needs some kind of intervention ASAP.  She is going to talk to Terri and see when the appt was made.  Any suggestions from Dr. Rodena Medin??? Additional Follow-up by: Lynann Beaver CMA AAMA,  February 08, 2010 2:46 PM    Additional Follow-up for Phone Call Additional follow up Details #2::    Terri, please call this pt back and let her know the status of her wound center appt. Follow-up by: Lynann Beaver CMA AAMA,  February 08, 2010 2:53 PM  Additional Follow-up for Phone Call Additional follow up Details #3:: Details for Additional Follow-up Action Taken: beginning abx. make sure has pain medication on hand (think has ultram), check on wound clinic appt and of course if pain is severe/unbearable/uncontrolled with pill pain medication then the ED is appropriate Additional Follow-up by: Edwyna Perfect MD,  February 08, 2010 2:59 PM   Appended Document: Returning Call to Nurse wound center appointment is januarty 16 at 9 am- arrive at 8:30--[address] beside [address] 3rd floor  Appended Document: Returning Call to Nurse Pt given Dr. Ty Hilts recommendations, and given her appt at the wound clinic.

## 2010-03-07 NOTE — Assessment & Plan Note (Signed)
Summary: non healing skin lesion/dm   Vital Signs:  Patient profile:   70 year old female Weight:      134 pounds Pulse rate:   78 / minute BP sitting:   162 / 98  (left arm)  Vitals Entered By: Kyung Rudd, CMA (February 07, 2010 12:04 PM) CC: c/o wound that won't heal...rheumatologist told her it's due to the enbrel so he took her off enbrel and arava   CC:  c/o wound that won't heal...rheumatologist told her it's due to the enbrel so he took her off enbrel and arava.  History of Present Illness: Patient presents to clinic as a workin for evaluation of leg sore. Hx obtained from pt and pt's husband who participates with her permission. Notes several month h/o left lower lateral leg ucler that is non-healing. No injury/trauma to the area and did not begin as skin growth/lesion. Attempted 7d course of keflex 12/21 without improvement. Notes area is painful. Denies f/c. Does have intermittent minimal drainage from area. Area is not increasing in size and there are no other known ulcers. No known h/o PAD or DM but does have h/o RA followed by rheumatology who reportedly recently stopped enbrel and arava.  Current Medications (verified): 1)  Folic Acid 20 Mg  Caps (Folic Acid) .... Once Daily 2)  Premarin 0.625 Mg  Tabs (Estrogens Conjugated) .... Once Daily 3)  Levothyroxine Sodium 50 Mcg  Tabs (Levothyroxine Sodium) .... Once Daily 4)  Oscal 500/200 D-3 500-200 Mg-Unit  Tabs (Calcium-Vitamin D) .... Once Daily 5)  Effexor Xr 75 Mg Xr24h-Cap (Venlafaxine Hcl) .Marland Kitchen.. 1 Once Daily 6)  Foltrin   Caps (Fe Fumarate-B12-Vit C-Fa-Ifc) .Marland Kitchen.. 1 Two Times A Day 7)  Detrol La 4 Mg  Cp24 (Tolterodine Tartrate) .... Two Times A Day 8)  Lovaza 1 Gm  Caps (Omega-3-Acid Ethyl Esters) .... Two By Mouth  Two Times A Day  ( Branded Fish Oil) 9)  Ultram 50 Mg  Tabs (Tramadol Hcl) .... One By Mouth Every 4 Hours 10)  Advair Diskus 100-50 Mcg/dose Aepb (Fluticasone-Salmeterol) .... One Puff By Mouth Two Times A  Day 11)  Silenor 6 Mg Tabs (Doxepin Hcl) .... One By Mouth  At Bed Time 12)  Betamethasone Dipropionate Aug 0.05 % Lotn (Betamethasone Dipropionate Aug) .... Add 30% Bethamethasone To 70% Sarna Lotion and Aplly To Rash Bid  Allergies (verified): 1)  ! Pcn  Past History:  Past medical, surgical, family and social histories (including risk factors) reviewed, and no changes noted (except as noted below).  Past Medical History: Reviewed history from 06/01/2007 and no changes required. Osteoporosis Rheumatoid arthritis Hypothyroidism GERD Depression COPD cardiolyte 2001 Hyperlipidemia  Past Surgical History: Reviewed history from 12/07/2006 and no changes required. Hysterectomy Tonsillectomy foot surgery  Family History: Reviewed history from 12/07/2006 and no changes required. Family History of CAD Female 1st degree relative <50  Social History: Reviewed history from 12/07/2006 and no changes required. Retired Married Former Smoker  Review of Systems      See HPI  Physical Exam  General:  Well-developed,well-nourished,in no acute distress; alert,appropriate and cooperative throughout examination Head:  Normocephalic and atraumatic without obvious abnormalities. No apparent alopecia or balding. Eyes:  pupils equal, pupils round, corneas and lenses clear, and no injection.   Ears:  no external deformities.   Nose:  no external deformity.   Skin:  Left lower lateral leg: small  ~1cm well circumscribed skin ulcer. +yellow exudate. +mild tenderness. No significant surrounding erythema or heat.  No expressible drainage. Psych:  Oriented X3.     Impression & Recommendations:  Problem # 1:  ULCER, LEG, CHRONIC (ICD-707.10) Assessment Deteriorated Reattempt abx course. Due to chronicity of ulcer do recommend and will schedule wound clinic consult for assistance in management.  Orders: Wound Care Center Referral (Wound Care)  Problem # 2:  ELEVATED BP READING WITHOUT DX  HYPERTENSION (ICD-796.2) Assessment: New Asx. Suspect isolated elevation with contribution from painful leg ulcer. Recommend outpt blood pressure monitoring (nl parameters given) and report abn results.  Complete Medication List: 1)  Folic Acid 20 Mg Caps (Folic acid) .... Once daily 2)  Premarin 0.625 Mg Tabs (Estrogens conjugated) .... Once daily 3)  Levothyroxine Sodium 50 Mcg Tabs (Levothyroxine sodium) .... Once daily 4)  Oscal 500/200 D-3 500-200 Mg-unit Tabs (Calcium-vitamin d) .... Once daily 5)  Effexor Xr 75 Mg Xr24h-cap (Venlafaxine hcl) .Marland Kitchen.. 1 once daily 6)  Foltrin Caps (Fe fumarate-b12-vit c-fa-ifc) .Marland Kitchen.. 1 two times a day 7)  Detrol La 4 Mg Cp24 (Tolterodine tartrate) .... Two times a day 8)  Lovaza 1 Gm Caps (Omega-3-acid ethyl esters) .... Two by mouth  two times a day  ( branded fish oil) 9)  Ultram 50 Mg Tabs (Tramadol hcl) .... One by mouth every 4 hours 10)  Advair Diskus 100-50 Mcg/dose Aepb (Fluticasone-salmeterol) .... One puff by mouth two times a day 11)  Silenor 6 Mg Tabs (Doxepin hcl) .... One by mouth  at bed time 12)  Betamethasone Dipropionate Aug 0.05 % Lotn (Betamethasone dipropionate aug) .... Add 30% bethamethasone to 70% sarna lotion and aplly to rash bid 13)  Septra Ds 800-160 Mg Tabs (Sulfamethoxazole-trimethoprim) .... One by mouth bid Prescriptions: SEPTRA DS 800-160 MG TABS (SULFAMETHOXAZOLE-TRIMETHOPRIM) one by mouth bid  #14 x 0   Entered and Authorized by:   Edwyna Perfect MD   Signed by:   Edwyna Perfect MD on 02/07/2010   Method used:   Telephoned to ...       [facility] (retail)       [address]       [place], [place]  [address]       Ph: [phone]       Fax: [phone]   RxID:   [id number] SEPTRA DS 800-160 MG TABS (SULFAMETHOXAZOLE-TRIMETHOPRIM) one by mouth bid  #14 x 0   Entered and Authorized by:   Edwyna Perfect MD   Signed by:   Edwyna Perfect MD on 02/07/2010   Method used:   Print then Give to  Patient   RxID:   [id number]    Orders Added: 1)  Wound Care Center Referral [Wound Care] 2)  Est. Patient Level IV [74259]

## 2010-03-07 NOTE — Progress Notes (Signed)
Summary: REQ FOR REFILL RX (Lovaza 1 gm)  Phone Note Refill Request Call back at [phone] Message from:  Patient on Jun 11, 2009 10:44 AM  Refills Requested: Medication #1:  LOVAZA 1 GM  CAPS two by mouth  two times a day  ( branded fish oil)   Notes: Pt req that refill Rx be sent to [facility].    Prescriptions: LOVAZA 1 GM  CAPS (OMEGA-3-ACID ETHYL ESTERS) two by mouth  two times a day  ( branded fish oil)  #360 x 3   Entered by:   Willy Eddy, LPN   Authorized by:   Stacie Glaze MD   Signed by:   Willy Eddy, LPN on 78/29/5621   Method used:   Electronically to        [facility]* (mail-order)             ,          Ph: [phone]       Fax: [phone]   RxID:   [id number]

## 2010-03-07 NOTE — Progress Notes (Signed)
  Left a message with patient to see if she came to the lab on 04-16-09.

## 2010-03-07 NOTE — Assessment & Plan Note (Signed)
Summary: 3 month rov/njr   Vital Signs:  Patient profile:   70 year old female Height:      63 inches Weight:      138 pounds BMI:     24.53 Temp:     98.2 degrees F oral Pulse rate:   88 / minute Resp:     14 per minute BP sitting:   156 / 82  (left arm)  Vitals Entered By: Willy Eddy, LPN (April 16, 2009 10:09 AM) CC: roa, Lipid Management   CC:  roa and Lipid Management.  History of Present Illness: pt's rhematologist thinks she need foot surgery and she wasnt a referal to a foot orthopedist she would not like to have the surgery at [facility] but in [place] she is followed by Dr Lum Babe at [facility] her thumb on the right  has lost of use she has multiple hammer toe deformitied her pain control is adequte gate is an issue  Lipid Management History:      Positive NCEP/ATP III risk factors include female age 90 years old or older, family history for ischemic heart disease (males less than 35 years old), and hypertension.  Negative NCEP/ATP III risk factors include non-tobacco-user status, no ASHD (atherosclerotic heart disease), no prior stroke/TIA, no peripheral vascular disease, and no history of aortic aneurysm.     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 Once Daily 2)  Premarin 0.625 Mg  Tabs (Estrogens Conjugated) .... Once Daily 3)  Levothyroxine Sodium 50 Mcg  Tabs (Levothyroxine Sodium) .... Once Daily 4)  Oscal 500/200 D-3 500-200 Mg-Unit  Tabs (Calcium-Vitamin D) .... Once Daily 5)  Arava 20 Mg  Tabs (Leflunomide) .... Once Daily 6)  Restoril 15 Mg  Caps (Temazepam) .... Q Hs 7)  Effexor Xr 75 Mg Xr24h-Cap (Venlafaxine Hcl) .Marland Kitchen.. 1 Once Daily 8)  Foltrin   Caps (Fe Fumarate-B12-Vit C-Fa-Ifc) .... Once Daily 9)  Detrol La 4 Mg  Cp24 (Tolterodine Tartrate) .... Two Times A Day 10)  Vitamin D 09811 Unit  Caps (Ergocalciferol) .... One By Mouth Weekly 11)  Lovaza 1 Gm  Caps (Omega-3-Acid Ethyl Esters) .... Two By Mouth  Two Times A Day  ( Branded Fish Oil) 12)  Ultram 50 Mg  Tabs (Tramadol Hcl) .... One By Mouth Every 4 Hours 13)  Enbrel Sureclick 50 Mg/ml Soln (Etanercept) .... One Prefilled Injection Weekly 14)  Hydromet 5-1.5 Mg/42ml Syrp (Hydrocodone-Homatropine) .... 2 Teaspoons By Mouth Q 6 Hours As Needed Cough  15)  Advair Diskus 100-50 Mcg/dose Aepb (Fluticasone-Salmeterol) .... One Puff By Mouth Two Times A Day  Current Medications (verified): 1)  Folic Acid 20 Mg  Caps (Folic Acid) .... Once Daily 2)  Premarin 0.625 Mg  Tabs (Estrogens Conjugated) .... Once Daily 3)  Levothyroxine Sodium 50 Mcg  Tabs (Levothyroxine Sodium) .... Once Daily 4)  Oscal 500/200 D-3 500-200 Mg-Unit  Tabs (Calcium-Vitamin D) .... Once Daily 5)  Arava 20 Mg  Tabs (Leflunomide) .... Once Daily 6)  Restoril 15 Mg  Caps (Temazepam) .... Q Hs 7)  Effexor Xr 75 Mg Xr24h-Cap (Venlafaxine Hcl) .Marland Kitchen.. 1 Once Daily 8)  Foltrin   Caps (Fe Fumarate-B12-Vit C-Fa-Ifc) .... Once Daily 9)  Detrol La 4 Mg  Cp24 (Tolterodine Tartrate) .... Two Times A  Day 10)  Lovaza 1 Gm  Caps (Omega-3-Acid Ethyl Esters) .... Two By Mouth  Two Times A Day  ( Branded Fish Oil) 11)  Ultram 50 Mg  Tabs (Tramadol Hcl) .... One By Mouth Every 4 Hours 12)  Enbrel Sureclick 50 Mg/ml Soln (Etanercept) .... One Prefilled Injection Weekly 13)  Hydromet 5-1.5 Mg/58ml Syrp (Hydrocodone-Homatropine) .... 2 Teaspoons By Mouth Q 6 Hours As Needed Cough 14)  Advair Diskus 100-50 Mcg/dose Aepb (Fluticasone-Salmeterol) .... One Puff By Mouth Two Times A Day  Allergies (verified): No Known Drug Allergies  Past History:  Family History: Last updated: 12/07/2006 Family History of CAD Female 1st degree relative <50  Social History: Last updated: 12/07/2006 Retired Married Former Smoker  Risk Factors: Smoking Status: quit (04/16/2009) Passive Smoke Exposure: no (04/16/2009)  Past medical, surgical, family and social histories (including risk factors) reviewed, and no changes noted (except as noted below).  Past Medical History: Reviewed history from 06/01/2007 and no changes required. Osteoporosis Rheumatoid arthritis Hypothyroidism GERD Depression COPD cardiolyte 2001 Hyperlipidemia  Past Surgical History: Reviewed history from 12/07/2006 and no changes required. Hysterectomy Tonsillectomy foot surgery  Family History: Reviewed history from 12/07/2006 and no changes required. Family History of CAD Female 1st degree relative <50  Social History: Reviewed history from 12/07/2006 and no changes required. Retired Married Former Smoker  Review of Systems       The patient complains of muscle weakness, suspicious skin lesions, difficulty walking, and depression.  The patient denies anorexia, fever, weight loss, weight gain, vision loss, decreased hearing, hoarseness, chest pain, syncope, dyspnea on exertion, peripheral edema, prolonged cough, headaches, hemoptysis, abdominal pain, melena, hematochezia, severe indigestion/heartburn, hematuria,  incontinence, genital sores, transient blindness, unusual weight change, abnormal bleeding, enlarged lymph nodes, angioedema, and breast masses.    Physical Exam  General:  alert and pale.   Head:  normocephalic and no abnormalities observed.   Eyes:  pupils equal and pupils round.   Ears:  R ear normal and L ear normal.   Nose:  no external deformity and no nasal discharge.   Mouth:  Oral mucosa and oropharynx without lesions or exudates.  Teeth in good repair. Lungs:  normal respiratory effort.   decreased at bases Heart:  normal rate and regular rhythm.   Abdomen:  soft and non-tender.   Msk:  decreased ROM, joint tenderness, joint swelling, and enlarged MCP joints.  deformed thumb on right  and multiple hammer toe deformities Pulses:  R and L carotid,radial,femoral,dorsalis pedis and posterior tibial pulses are full and equal bilaterally Extremities:  1+ left pedal edema and 1+ right pedal edema.   Neurologic:  alert & oriented X3.     Impression & Recommendations:  Problem #  1:  HAMMER TOE (ICD-755.66)  need referral to  Orders: Orthopedic Surgeon Referral (Ortho Surgeon)  Problem # 2:  HYPERLIPIDEMIA (ICD-272.4) continue the fish oil Her updated medication list for this problem includes:    Lovaza 1 Gm Caps (Omega-3-acid ethyl esters) .Marland Kitchen..Marland Kitchen Two by mouth  two times a day  ( branded fish oil)  Labs Reviewed: SGOT: 27 (01/15/2009)   SGPT: 20 (01/15/2009)  Lipid Goals: Chol Goal: 200 (02/22/2008)   HDL Goal: 40 (02/22/2008)   LDL Goal: 130 (02/22/2008)   TG Goal: 150 (02/22/2008)  10 Yr Risk Heart Disease: Not enough information Prior 10 Yr Risk Heart Disease: 17 % (02/22/2008)   HDL:48.10 (01/15/2009), 55.50 (08/14/2008)  LDL:DEL (11/10/2007), DEL (08/16/2007)  Chol:217 (01/15/2009), 231 (08/14/2008)  Trig:121 (11/10/2007), 190 (08/16/2007)  Problem # 3:  RHEUMATOID ARTHRITIS (ICD-714.0) monitering  at [facility] refer to hand specialist Orders: Orthopedic Surgeon  Referral (Ortho Surgeon) Orthopedic Referral (Ortho)  Problem # 4:  LUPUS ERYTHEMATOSUS, 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 Once daily    Enbrel Sureclick 50 Mg/ml Soln (Etanercept) ..... One prefilled injection weekly  Complete Medication List: 1)  Folic Acid 20 Mg Caps (Folic acid) .... Once daily 2)  Premarin 0.625 Mg Tabs (Estrogens conjugated) .... Once daily 3)  Levothyroxine Sodium 50 Mcg Tabs (Levothyroxine sodium) .... Once daily 4)  Oscal 500/200 D-3 500-200 Mg-unit Tabs (Calcium-vitamin d) .... Once daily 5)  Arava 20 Mg Tabs (Leflunomide) .... Once daily 6)  Restoril 15 Mg Caps (Temazepam) .... Q hs 7)  Effexor Xr 75 Mg Xr24h-cap (Venlafaxine hcl) .Marland Kitchen.. 1 once daily 8)  Foltrin Caps (Fe fumarate-b12-vit c-fa-ifc) .... Once daily 9)  Detrol La 4 Mg Cp24 (Tolterodine tartrate) .... Two times a day 10)  Lovaza 1 Gm Caps (Omega-3-acid ethyl esters) .... Two by mouth  two times a day  ( branded fish oil) 11)  Ultram 50 Mg Tabs (Tramadol hcl) .... One by mouth every 4 hours 12)  Enbrel Sureclick 50 Mg/ml Soln (Etanercept) .... One prefilled injection weekly 13)  Hydromet 5-1.5 Mg/44ml Syrp (Hydrocodone-homatropine) .... 2 teaspoons by mouth q 6 hours as needed cough 14)  Advair Diskus 100-50 Mcg/dose Aepb (Fluticasone-salmeterol) .... One puff by mouth two times a day  Other Orders: Venipuncture (21308) TLB-CBC Platelet - w/Differential (85025-CBCD) TLB-Hepatic/Liver Function Pnl (80076-HEPATIC) Prescription Created Electronically [phone])  Lipid Assessment/Plan:      Based on NCEP/ATP III, the patient's risk factor category is "2 or more risk factors and a calculated 10 year CAD risk of > 20%".  The patient's lipid goals are as follows: Total cholesterol goal is 200; LDL cholesterol goal is 130; HDL cholesterol goal is 40; Triglyceride goal is 150.  Her LDL cholesterol goal has  been met.    Patient Instructions: 1)  Please schedule a follow-up appointment in 3 months. Prescriptions: DETROL LA 4 MG  CP24 (TOLTERODINE TARTRATE) two times a day  #180 x 3   Entered by:   Willy Eddy, LPN   Authorized by:   Stacie Glaze MD   Signed by:   Willy Eddy, LPN on 69/62/9528   Method used:   Electronically to        [facility] (mail-order)             ,          Ph: [phone]  Fax: [phone]   RxID:   [id number] EFFEXOR XR 75 MG XR24H-CAP (VENLAFAXINE HCL) 1 once daily  #90 x 3   Entered by:   Willy Eddy, LPN   Authorized by:   Stacie Glaze MD   Signed by:   Willy Eddy, LPN on 84/69/6295   Method used:   Electronically to        [facility]* (mail-order)             ,          Ph: [phone]       Fax: [phone]   RxID:   [id number] LEVOTHYROXINE SODIUM 50 MCG  TABS (LEVOTHYROXINE SODIUM) once daily  #90 x 3   Entered by:   Willy Eddy, LPN   Authorized by:   Stacie Glaze MD   Signed by:   Willy Eddy, LPN on 33/29/5188   Method used:   Electronically to        [facility]* (mail-order)             ,          Ph: [phone]       Fax: [phone]   RxID:   [id number] PREMARIN 0.625 MG  TABS (ESTROGENS CONJUGATED) once daily  #90 x 3   Entered by:   Willy Eddy, LPN   Authorized by:   Stacie Glaze MD   Signed by:   Willy Eddy, LPN on 83/15/1761   Method used:   Electronically to        [facility]* (mail-order)             ,          Ph: [phone]       Fax: [phone]   RxID:   [id number]

## 2010-03-07 NOTE — Progress Notes (Signed)
Summary: Rx Refill  Phone Note Refill Request Message from:  Patient on February 11, 2010 12:43 PM  Refills Requested: Medication #1:  Ferocon Capsules [facility]   Method Requested: Electronic Initial call taken by: Trixie Dredge,  February 11, 2010 12:43 PM    New/Updated Medications: FEROCON  CAPS (FE FUMARATE-B12-VIT C-FA-IFC) 1 two times a day Prescriptions: FEROCON  CAPS (FE FUMARATE-B12-VIT C-FA-IFC) 1 two times a day  #180 x 3   Entered by:   Willy Eddy, LPN   Authorized by:   Stacie Glaze MD   Signed by:   Willy Eddy, LPN on 16/11/9602   Method used:   Print then Give to Patient   RxID:   [id number]

## 2010-03-07 NOTE — Letter (Signed)
Summary: Rheumatology/[facility] [place]  Rheumatology/[facility]   Imported By: Sherian Rein 12/18/2009 13:19:26  _____________________________________________________________________  External Attachment:    Type:   Image     Comment:   External Document

## 2010-03-07 NOTE — Medication Information (Signed)
Summary: Prior Authorization Request for Lovaza  Prior Authorization Request for Lovaza   Imported By: Maryln Gottron 07/09/2009 11:07:24  _____________________________________________________________________  External Attachment:    Type:   Image     Comment:   External Document

## 2010-03-07 NOTE — Assessment & Plan Note (Signed)
Summary: 3 MONTH ROV/NJR   Vital Signs:  Patient profile:   70 year old female Height:      63 inches Weight:      134 pounds BMI:     23.82 Temp:     98.2 degrees F oral Pulse rate:   80 / minute Resp:     14 per minute BP sitting:   144 / 80  (left arm)  Vitals Entered By: Willy Eddy, LPN (July 11, 452 1:29 PM) CC: roa- states she needs labs today for rheumatologist, Lipid Management   CC:  roa- states she needs labs today for rheumatologist and Lipid Management.  History of Present Illness: rate the pain from RA is a 7/10 and the pain meds are adequate she denies depression she cannot sleep well at night she is faithful with the effexor her GERD is stable   Lipid Management History:      Positive NCEP/ATP III risk factors include female age 61 years old or older, family history for ischemic heart disease (males less than 42 years old), and hypertension.  Negative NCEP/ATP III risk factors include non-tobacco-user status, no ASHD (atherosclerotic heart disease), no prior stroke/TIA, no peripheral vascular disease, and no history of aortic aneurysm.     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) 8)  Hyperlipidemia  (ICD-272.4) 9)  Encounter For Long-term Use of Other Medications  (ICD-V58.69) 10)  Chest Xray, Abnormal  (ICD-793.1) 11)  Family History of Cad Female 1st Degree Relative <50  (ICD-V17.3) 12)  COPD  (ICD-496) 13)  Depression  (ICD-311) 14)  Gerd  (ICD-530.81) 15)  Sjogren's Syndrome, Hx of  (ICD-V13.8) 16)  Hypothyroidism   (ICD-244.9) 17)  Rheumatoid Arthritis  (ICD-714.0) 18)  Osteoporosis  (ICD-733.00)  Current Problems (verified): 1)  Lung Nodule  (ICD-518.89) 2)  Hammer Toe  (ICD-755.66) 3)  Acute Bronchitis  (ICD-466.0) 4)  Allergic Rhinitis Due To Pollen  (ICD-477.0) 5)  Pain in Joint, Shoulder Region  (ICD-719.41) 6)  Uns Advrs Eff Uns Rx Medicinal&biological Sbstnc  (ICD-995.20) 7)  Lupus Erythematosus, 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 Once Daily 2)  Premarin 0.625 Mg  Tabs (Estrogens Conjugated) .... Once Daily 3)  Levothyroxine Sodium 50 Mcg  Tabs (Levothyroxine Sodium) .... Once Daily 4)  Oscal 500/200 D-3 500-200 Mg-Unit  Tabs (Calcium-Vitamin D) .... Once Daily 5)  Arava 20 Mg  Tabs (Leflunomide) .... Once Daily 6)  Restoril 15 Mg  Caps (Temazepam) .... Q Hs 7)  Effexor Xr 75 Mg  Xr24h-Cap (Venlafaxine Hcl) .Marland Kitchen.. 1 Once Daily 8)  Foltrin   Caps (Fe Fumarate-B12-Vit C-Fa-Ifc) .... Once Daily 9)  Detrol La 4 Mg  Cp24 (Tolterodine Tartrate) .... Two Times A Day 10)  Lovaza 1 Gm  Caps (Omega-3-Acid Ethyl Esters) .... Two By Mouth  Two Times A Day  ( Branded Fish Oil) 11)  Ultram 50 Mg  Tabs (Tramadol Hcl) .... One By Mouth Every 4 Hours 12)  Enbrel Sureclick 50 Mg/ml Soln (Etanercept) .... One Prefilled Injection Weekly 13)  Hydromet 5-1.5 Mg/30ml Syrp (Hydrocodone-Homatropine) .... 2 Teaspoons By Mouth Q 6 Hours As Needed Cough 14)  Advair Diskus 100-50 Mcg/dose Aepb (Fluticasone-Salmeterol) .... One Puff By  Mouth Two Times A Day  Current Medications (verified): 1)  Folic Acid 20 Mg  Caps (Folic Acid) .... Once Daily 2)  Premarin 0.625 Mg  Tabs (Estrogens Conjugated) .... Once Daily 3)  Levothyroxine Sodium 50 Mcg  Tabs (Levothyroxine Sodium) .... Once Daily 4)  Oscal 500/200 D-3 500-200 Mg-Unit  Tabs (Calcium-Vitamin D) .... Once Daily 5)  Arava 20 Mg  Tabs (Leflunomide) .... Once Daily 6)  Effexor Xr 75 Mg Xr24h-Cap (Venlafaxine Hcl) .Marland Kitchen.. 1 Once Daily 7)  Foltrin   Caps (Fe Fumarate-B12-Vit C-Fa-Ifc) .... Once Daily 8)  Detrol La 4 Mg  Cp24 (Tolterodine Tartrate) .... Two Times A Day 9)  Lovaza 1 Gm  Caps (Omega-3-Acid Ethyl Esters) .... Two By Mouth  Two Times A Day  ( Branded Fish Oil) 10)  Ultram 50 Mg  Tabs (Tramadol Hcl) .... One By Mouth Every 4 Hours 11)  Enbrel Sureclick 50 Mg/ml Soln (Etanercept) .... One Prefilled Injection Weekly 12)  Advair Diskus 100-50 Mcg/dose Aepb (Fluticasone-Salmeterol) .... One Puff By Mouth Two Times A Day 13)  Silenor 6 Mg Tabs (Doxepin Hcl) .... One By Mouth  At Bed Time  Allergies (verified): No Known Drug Allergies  Past History:  Family History: Last updated: 12/07/2006 Family History of CAD Female 1st degree relative <50  Social History: Last updated: 12/07/2006 Retired Married Former Smoker  Risk Factors: Smoking Status: quit (07/11/2009) Passive Smoke Exposure: no (07/11/2009)  Past medical, surgical, family and social histories (including risk factors) reviewed, and no changes noted (except as noted below).  Past Medical History: Reviewed history from 06/01/2007 and no changes required. Osteoporosis Rheumatoid arthritis Hypothyroidism GERD Depression COPD cardiolyte 2001 Hyperlipidemia  Past Surgical History: Reviewed history from 12/07/2006 and no changes required. Hysterectomy Tonsillectomy foot surgery  Family History: Reviewed history from 12/07/2006 and no changes required. Family History of CAD Female 1st degree  relative <50  Social History: Reviewed history from 12/07/2006 and no changes required. Retired Married Former Smoker  Review of Systems       The patient complains of weight loss and hoarseness.  The patient denies anorexia, fever, weight gain, vision loss, decreased hearing, chest pain, syncope, dyspnea on exertion, peripheral edema, prolonged cough, headaches, hemoptysis, abdominal pain, melena, hematochezia, severe indigestion/heartburn, hematuria, incontinence, genital sores, muscle weakness, suspicious skin lesions, transient blindness, difficulty walking, depression, unusual weight change, abnormal bleeding, enlarged lymph nodes, angioedema, and breast masses.    Physical Exam  General:  alert and pale.   Head:  normocephalic and no abnormalities observed.   Eyes:  pupils equal and pupils round.   Ears:  R ear normal and L ear normal.   Neck:  full ROM and no masses.   Lungs:  normal respiratory effort.   decreased at bases Heart:  normal rate and regular rhythm.  Abdomen:  soft and non-tender.   Msk:  joint tenderness and joint swelling.  deformations seen Extremities:  trace left pedal edema and trace right pedal edema.   Neurologic:  alert & oriented X3 and DTRs symmetrical and normal.     Impression & Recommendations:  Problem # 1:  TRANSIENT DISORDER INITIATING/MAINTAINING SLEEP (ICD-307.41) Assessment Deteriorated silenor trial  Problem # 2:  COPD (ICD-496) Assessment: Improved samples given , stable Her updated medication list for this problem includes:    Advair Diskus 100-50 Mcg/dose Aepb (Fluticasone-salmeterol) ..... One puff by mouth two times a day  Problem # 3:  GERD (ICD-530.81)  Labs Reviewed: Hgb: 11.5 (01/15/2009)   Hct: 34.9 (01/15/2009)  Problem # 4:  UNS ADVRS EFF UNS RX MEDICINAL&BIOLOGICAL SBSTNC (ICD-995.20)  monitering liver and platlets fro drug side effecs  Orders: TLB-CBC Platelet - w/Differential (85025-CBCD) TLB-Hepatic/Liver  Function Pnl (80076-HEPATIC) Venipuncture (40981)  Complete Medication List: 1)  Folic Acid 20 Mg Caps (Folic acid) .... Once daily 2)  Premarin 0.625 Mg Tabs (Estrogens conjugated) .... Once daily 3)  Levothyroxine Sodium 50 Mcg Tabs (Levothyroxine sodium) .... Once daily 4)  Oscal 500/200 D-3 500-200 Mg-unit Tabs (Calcium-vitamin d) .... Once daily 5)  Arava 20 Mg Tabs (Leflunomide) .... Once daily 6)  Effexor Xr 75 Mg Xr24h-cap (Venlafaxine hcl) .Marland Kitchen.. 1 once daily 7)  Foltrin Caps (Fe fumarate-b12-vit c-fa-ifc) .... Once daily 8)  Detrol La 4 Mg Cp24 (Tolterodine tartrate) .... Two times a day 9)  Lovaza 1 Gm Caps (Omega-3-acid ethyl esters) .... Two by mouth  two times a day  ( branded fish oil) 10)  Ultram 50 Mg Tabs (Tramadol hcl) .... One by mouth every 4 hours 11)  Enbrel Sureclick 50 Mg/ml Soln (Etanercept) .... One prefilled injection weekly 12)  Advair Diskus 100-50 Mcg/dose Aepb (Fluticasone-salmeterol) .... One puff by mouth two times a day 13)  Silenor 6 Mg Tabs (Doxepin hcl) .... One by mouth  at bed time  Lipid Assessment/Plan:      Based on NCEP/ATP III, the patient's risk factor category is "2 or more risk factors and a calculated 10 year CAD risk of > 20%".  The patient's lipid goals are as follows: Total cholesterol goal is 200; LDL cholesterol goal is 130; HDL cholesterol goal is 40; Triglyceride goal is 150.  Her LDL cholesterol goal has been met.    Patient Instructions: 1)  will fax blood work to Dr Carolan Clines at [facility] 2)  Please schedule a follow-up appointment in 2 months.

## 2010-03-14 ENCOUNTER — Other Ambulatory Visit: Payer: Self-pay | Admitting: Internal Medicine

## 2010-03-15 NOTE — Discharge Summary (Signed)
Kristen Lee, Kristen Lee           ACCOUNT NO.:  [id number]  MEDICAL RECORD NO.:  [id number]           PATIENT TYPE:  I  LOCATION:  1508                         FACILITY:  [facility]  PHYSICIAN:  Wilmon Arms. Corliss Skains, M.D. DATE OF BIRTH:  11/02/1940  DATE OF ADMISSION:  02/25/2010 DATE OF DISCHARGE:  03/07/2010                              DISCHARGE SUMMARY   DISCHARGING PHYSICIAN:  Wilmon Arms. Benett Swoyer, M.D.  PRIMARY CARE PHYSICIAN:  Stacie Glaze, M.D.  RHEUMATOLOGIST:  Louann Liv, M.D.  CONSULTATIONS:  Juanetta Gosling, M.D. with General Surgery  PROCEDURES:  Exploratory laparotomy with lysis of adhesions by Dr. Dwain Sarna on February 28, 2010.  REASON FOR ADMISSION:  Ms. Stephen is a 70 year old female who presented to [facility] Emergency Department with complaints of abdominal pain and vomiting that started the Saturday prior to admission.  Initially the patient's thought that the symptoms were related to the antibiotic that she was taking for her leg wound.  However, they became worse and therefore she presented to the Emergency Department.  Upon arrival she had a CT scan completed, which revealed a moderate small bowel obstruction.  Please see admitting history and physical for further details.  ADMITTING DIAGNOSES: 1. Small bowel obstruction. 2. Leukocytosis, mild. 3. Left leg wound, which is chronic.  HOSPITAL COURSE:  At this time the patient was admitted and an NG tube was placed.  The next several days she was kept n.p.o. with an NG tube. She had daily x-rays, which revealed persistent partial small bowel obstruction.  On hospital day two General Surgery was asked to evaluate the patient as she did not seem to be making any progress with just conservative management with an NG tube.  After evaluation it was felt that if the patient did not have any improvement by the next day, that based off of her x-rays as well as her clinical symptoms she would  need surgical intervention.  The following day the patient remained without flatus and continued to have tenderness in the right mid abdomen. Therefore, she was taken to the operating room where she was found to have a one band adhesion.  This was lysed, releasing her bowel obstruction.  Postoperatively, the patient had an uncomplicated course. The first several days the patient had a postoperative ileus.  However, her NG tube was able to be taken out on postoperative day three. However, she was left n.p.o. until postoperative day four.  At that time her diet was advanced to clear liquids and then advanced as tolerated. On postoperative day six, the patient did have some nausea after taking oral pain medicine which persisted throughout the day.  However, she was able to tolerate a regular diet.  She was kept until postoperative day seven, at which time she was not having any further nausea and had no other complaints.  At this time the patient was felt stable for discharge home.  At this time her abdomen was soft, essentially nontender, nondistended with active bowel sounds and her staples were still intact with her wound being clean, dry and intact with no erythema.  DISCHARGE DIAGNOSES: 1. Small bowel obstruction. 2. Status  post exploratory laparotomy with lysis of adhesions. 3. Postoperative ileus, resolved. 4. Leukocytosis, resolved. 5. Chronic leg ulcer. 6. Hyperlipidemia. 7. Chronic obstructive pulmonary disease. 8. Gastroesophageal reflux disease. 9. Depression. 10.Rheumatoid arthritis. 11.History of Sj"gren's syndrome.  DISCHARGE MEDICATIONS:  Please see medication reconciliation form.  DISCHARGE INSTRUCTIONS:  The patient may increase her activity slowly and walk up steps.  She may shower, however, she is not to bathe for at least the next 2 weeks.  She is not to do any heavy lifting greater than 15 to 20 pounds for the next 6 weeks.  She is not to drive for the next 1  to 2 weeks.  She has no dietary restrictions.  She is to call our office for a fever greater than 101.5 or worsening abdominal pain.  She is encouraged to follow up with her primary care physician for post hospitalization followup within 1 to 2 weeks of discharge.  She is also encouraged to follow up with the Wound Clinic as informed by them to her at last visit.  She is to see Elease Hashimoto, Dr. Doreen Salvage nurse, on Monday, February 6 at 9:30 a.m. for staple removal.  She will then need to follow up with Dr. Dwain Sarna 2 to 3 weeks after staple removal.     Letha Cape, PA   ______________________________ Wilmon Arms. Corliss Skains, M.D.    KEO/MEDQ  D:  03/07/2010  T:  03/07/2010  Job:  [id number]  cc:   Juanetta Gosling, MD [address] [place] [address]  Stacie Glaze, MD [address] [place] [address]  Louann Liv, M.D.  Electronically Signed by Barnetta Chapel PA on 03/13/2010 03:30:18 PM Electronically Signed by Manus Rudd M.D. on 03/15/2010 04:09:55 PM

## 2010-04-04 ENCOUNTER — Encounter (HOSPITAL_BASED_OUTPATIENT_CLINIC_OR_DEPARTMENT_OTHER): Payer: MEDICARE | Attending: Internal Medicine

## 2010-04-12 ENCOUNTER — Other Ambulatory Visit: Payer: Self-pay | Admitting: Internal Medicine

## 2010-04-16 ENCOUNTER — Other Ambulatory Visit: Payer: Medicare Other | Admitting: Internal Medicine

## 2010-04-16 ENCOUNTER — Telehealth: Payer: Self-pay | Admitting: Internal Medicine

## 2010-04-16 DIAGNOSIS — M069 Rheumatoid arthritis, unspecified: Secondary | ICD-10-CM

## 2010-04-16 LAB — HEPATIC FUNCTION PANEL
ALT: 13 U/L (ref 0–35)
AST: 24 U/L (ref 0–37)
Alkaline Phosphatase: 84 U/L (ref 39–117)
Bilirubin, Direct: 0.1 mg/dL (ref 0.0–0.3)
Total Bilirubin: 0.4 mg/dL (ref 0.3–1.2)

## 2010-04-16 LAB — CBC WITH DIFFERENTIAL/PLATELET
Eosinophils Relative: 1.6 % (ref 0.0–5.0)
HCT: 36.3 % (ref 36.0–46.0)
Lymphocytes Relative: 36.4 % (ref 12.0–46.0)
Lymphs Abs: 1.2 10*3/uL (ref 0.7–4.0)
Monocytes Relative: 5.4 % (ref 3.0–12.0)
Neutrophils Relative %: 56.2 % (ref 43.0–77.0)
Platelets: 125 10*3/uL — ABNORMAL LOW (ref 150.0–400.0)
WBC: 3.2 10*3/uL — ABNORMAL LOW (ref 4.5–10.5)

## 2010-04-16 NOTE — Telephone Encounter (Signed)
Cbc/lft ordered

## 2010-04-16 NOTE — Telephone Encounter (Signed)
Do you know what labs?

## 2010-04-16 NOTE — Telephone Encounter (Signed)
Pt said that [facility] called and said that pt needs to get lab work done at [facility]. Pt not sure what blood work is needed. Pt would like to come in today to get labs done. Pls advise.

## 2010-04-16 NOTE — H&P (Signed)
NAME:  DEMARI, Kristen Lee NO.:  [id number]  MEDICAL RECORD NO.:  [id number]          PATIENT TYPE:  EMS  LOCATION:  ED                           FACILITY:  [facility]  PHYSICIAN:  Erick Blinks, MD     DATE OF BIRTH:  [birth year]  DATE OF ADMISSION:  02/25/2010 DATE OF DISCHARGE:                             HISTORY & PHYSICAL   PRIMARY CARE PHYSICIAN:  Stacie Glaze, MD  RHEUMATOLOGIST:  Glendell Docker, MD.  CHIEF COMPLAINT:  Vomiting and abdominal pain.  HISTORY OF PRESENT ILLNESS:  Kristen Lee is a very pleasant 70 year old female with past medical history of rheumatoid arthritis, hyperlipidemia and COPD, who presents to [facility] Emergency Department today with complaints of abdominal pain and vomiting starting on Saturday, January 21.  Initially, the patient attributed her symptoms to prescribed antibiotics for a left leg wound.  However, symptoms became worse, prompting ER visit.  The patient is undergoing treatment of chronic left leg wound by [facility].  The patient states the wound is much improved since antibiotics were started last week.  The patient denies any recent fever, chills, chest pain, shortness of breath.  She denies any recent flatus.  Her last bowel movement was on January 21 and within normal limits.  The patient's daughter does report dark-brown emesis this morning.  Upon evaluation in the emergency department, a CT of the abdomen and pelvis reveals a moderate small bowel obstruction.  The patient is to be admitted by triad hospitalist at this time.  Surgery is to consult.  PAST MEDICAL HISTORY: 1. Chronic left leg ulcer, thought secondary to Enbrel and Arava.     Discontinued by rheumatologist.     a.     The patient is being followed at [facility]. 2. Hyperlipidemia. 3. COPD. 4. GERD. 5. Depression. 6. Rheumatoid arthritis. 7. History Sjogren syndrome. 8. History of remote hysterectomy. 9. Remote  tonsillectomy.  MEDICATIONS: 1. __________  1 tablet p.o. daily. 2. Doxycycline 100 mg p.o. b.i.d.; the patient has not been taking. 3. Folic acid 800 mcg p.o. daily. 4. Os-Cal 1 tablet p.o. daily. 5. Lovaza 1 g 2 capsules p.o. b.i.d. 6. Tramadol 50 mg p.o. q.6 h. p.r.n. pain. 7. Effexor 75 mg p.o. at bedtime. 8. Premarin 0.625 mg p.o. daily. 9. Detrol LA 4 mg p.o. b.i.d. 10.Levothyroxine 50 mcg p.o. daily.  ALLERGIES:  PENICILLIN causes tongue swelling.  FAMILY HISTORY:  Reviewed and noncontributory on this admission.  SOCIAL HISTORY:  The patient is married.  She lives at home with her husband.  She is a nonsmoker, nondrinker.  REVIEW OF SYSTEMS:  As stated in HPI, otherwise negative.  PHYSICAL EXAM:  VITAL SIGNS:  Blood pressure 140/78, heart rate 92, respirations 20, temperature 97.3, O2 sat is 96% on room air. GENERAL: This is an elderly Caucasian female sitting supine in a stretcher in no acute distress. HEAD:  Normocephalic, atraumatic. EYES:  Extraocular movements are intact without scleral icterus or injection. EAR, NOSE AND THROAT:  Mucous membranes are moist with no oropharyngeal lesions. NECK:  Neck is supple with no thyromegaly or lymphadenopathy.  No JVD or carotid  bruits. CHEST:  Symmetrical and nontender to palpation. CARDIOVASCULAR:  S1-S2, regular rate and rhythm.  No murmur, rub or gallop.  No lower extremity edema. RESPIRATORY:  Lung sounds are clear to auscultation bilaterally.  No wheezes, rales or crackles.  No increased work of breathing.  GI: Abdomen is soft.  Bowel sounds are hypoactive.  Generalized tenderness upon palpation throughout.  No appreciated masses or hepatosplenomegaly. NEUROLOGIC:  The patient is able to move all extremities x4 without motor or sensory deficit on exam. PSYCHOLOGIC:  The patient is alert and oriented x4, very pleasant mood and affect. SKIN:  The patient with left lateral leg wound.  Dressing clean, dry and intact.   The patient requested dressing not be undone at this time.  PERTINENT LABORATORY AND X-RAY DATA:  White cell count 12.0 with an 86% neutrophilic shift, platelet count 166, hemoglobin 15.0, hematocrit 43.2, sodium 135, potassium 4.1, chloride 99, CO2 of 26, BUN 24, creatinine 0.93, serum glucose 118, albumin 2.8, lipase 18.  Liver function tests within normal limits.  Urinalysis is completely unremarkable.  A CT of the abdomen and pelvis shows moderate grade small bowel obstruction with moderate abdominal and pelvic ascites.  ASSESSMENT/PLAN: 1. Small bowel obstruction.  Unclear etiology.  We will admit the     patient to regular medical floor.  The patient is to be n.p.o.     given persistent nausea and vomiting of stool-colored emesis.  We     will place nasogastric tube to decompress stomach.  Surgery has     been asked to consult and follow the patient with [place].  We will     recheck abdominal film in the morning and start proton pump     inhibitor therapy prophylactically. 2. Leukocytosis, mild.  Likely reactive in setting of small bowel     obstruction.  We will monitor. 3. Left leg wound.  The patient reports much improved.  We will     monitor.  The patient is followed closely by [facility] Care clinic.  We     will have the patient follow up as scheduled prior to this     admission. 4. Prophylaxis.  We will order for subcutaneous Lovenox for DVT     prophylaxis.     Cordelia Pen, NP   ______________________________ Erick Blinks, MD    LE/MEDQ  D:  02/25/2010  T:  02/25/2010  Job:  [id number]  cc:   Erick Blinks, MD  Electronically Signed by Cordelia Pen NP on 03/22/2010 12:11:49 PM Electronically Signed by Durward Mallard Giada Schoppe  on 04/16/2010 05:48:05 PM

## 2010-04-21 ENCOUNTER — Other Ambulatory Visit: Payer: Self-pay | Admitting: Internal Medicine

## 2010-04-26 ENCOUNTER — Encounter: Payer: Self-pay | Admitting: Internal Medicine

## 2010-05-13 ENCOUNTER — Other Ambulatory Visit: Payer: Self-pay | Admitting: Internal Medicine

## 2010-05-16 ENCOUNTER — Encounter (HOSPITAL_BASED_OUTPATIENT_CLINIC_OR_DEPARTMENT_OTHER): Payer: MEDICARE | Attending: Internal Medicine

## 2010-05-16 DIAGNOSIS — S81009A Unspecified open wound, unspecified knee, initial encounter: Secondary | ICD-10-CM | POA: Insufficient documentation

## 2010-05-16 DIAGNOSIS — Z22321 Carrier or suspected carrier of Methicillin susceptible Staphylococcus aureus: Secondary | ICD-10-CM | POA: Insufficient documentation

## 2010-05-16 DIAGNOSIS — M069 Rheumatoid arthritis, unspecified: Secondary | ICD-10-CM | POA: Insufficient documentation

## 2010-05-16 DIAGNOSIS — X58XXXA Exposure to other specified factors, initial encounter: Secondary | ICD-10-CM | POA: Insufficient documentation

## 2010-05-16 DIAGNOSIS — Z79899 Other long term (current) drug therapy: Secondary | ICD-10-CM | POA: Insufficient documentation

## 2010-06-06 ENCOUNTER — Encounter (HOSPITAL_BASED_OUTPATIENT_CLINIC_OR_DEPARTMENT_OTHER): Payer: MEDICARE | Attending: Internal Medicine

## 2010-06-06 DIAGNOSIS — Z79899 Other long term (current) drug therapy: Secondary | ICD-10-CM | POA: Insufficient documentation

## 2010-06-06 DIAGNOSIS — L97809 Non-pressure chronic ulcer of other part of unspecified lower leg with unspecified severity: Secondary | ICD-10-CM | POA: Insufficient documentation

## 2010-06-06 DIAGNOSIS — M329 Systemic lupus erythematosus, unspecified: Secondary | ICD-10-CM | POA: Insufficient documentation

## 2010-06-06 DIAGNOSIS — E039 Hypothyroidism, unspecified: Secondary | ICD-10-CM | POA: Insufficient documentation

## 2010-06-06 DIAGNOSIS — M069 Rheumatoid arthritis, unspecified: Secondary | ICD-10-CM | POA: Insufficient documentation

## 2010-06-06 DIAGNOSIS — I872 Venous insufficiency (chronic) (peripheral): Secondary | ICD-10-CM | POA: Insufficient documentation

## 2010-06-24 ENCOUNTER — Encounter: Payer: Self-pay | Admitting: Internal Medicine

## 2010-07-02 ENCOUNTER — Telehealth: Payer: Self-pay | Admitting: *Deleted

## 2010-07-02 NOTE — Telephone Encounter (Signed)
Pt has order from rheumatologist for lab work, needs to know if she can have labs drawn here.

## 2010-07-02 NOTE — Telephone Encounter (Signed)
Just schedule it and have her bring the order with diagnosis with her when she comes

## 2010-07-03 NOTE — Telephone Encounter (Signed)
What to order? 

## 2010-07-03 NOTE — Telephone Encounter (Signed)
Pt stated she does not have a order from rheum just requesting bloodwork she been having. Please advise

## 2010-07-04 ENCOUNTER — Other Ambulatory Visit (INDEPENDENT_AMBULATORY_CARE_PROVIDER_SITE_OTHER): Payer: MEDICARE

## 2010-07-04 DIAGNOSIS — M129 Arthropathy, unspecified: Secondary | ICD-10-CM

## 2010-07-04 DIAGNOSIS — M1990 Unspecified osteoarthritis, unspecified site: Secondary | ICD-10-CM

## 2010-07-04 LAB — CBC WITH DIFFERENTIAL/PLATELET
Eosinophils Absolute: 0 10*3/uL (ref 0.0–0.7)
HCT: 37.8 % (ref 36.0–46.0)
Lymphs Abs: 1 10*3/uL (ref 0.7–4.0)
MCHC: 34.3 g/dL (ref 30.0–36.0)
MCV: 101.1 fl — ABNORMAL HIGH (ref 78.0–100.0)
Monocytes Absolute: 0.2 10*3/uL (ref 0.1–1.0)
Neutrophils Relative %: 61.5 % (ref 43.0–77.0)
Platelets: 141 10*3/uL — ABNORMAL LOW (ref 150.0–400.0)

## 2010-07-04 LAB — C-PEPTIDE: C-Peptide: 7.47 ng/mL — ABNORMAL HIGH (ref 0.80–3.90)

## 2010-07-04 LAB — HEPATIC FUNCTION PANEL
Bilirubin, Direct: 0.2 mg/dL (ref 0.0–0.3)
Total Bilirubin: 0.7 mg/dL (ref 0.3–1.2)
Total Protein: 6 g/dL (ref 6.0–8.3)

## 2010-07-04 LAB — SEDIMENTATION RATE: Sed Rate: 24 mm/hr — ABNORMAL HIGH (ref 0–22)

## 2010-07-04 NOTE — Telephone Encounter (Signed)
Per dr Nita Sells rate,c peptide--pt walked in asking for labs without orders.

## 2010-07-11 ENCOUNTER — Encounter (HOSPITAL_BASED_OUTPATIENT_CLINIC_OR_DEPARTMENT_OTHER): Payer: MEDICARE | Attending: Internal Medicine

## 2010-07-11 DIAGNOSIS — L97809 Non-pressure chronic ulcer of other part of unspecified lower leg with unspecified severity: Secondary | ICD-10-CM | POA: Insufficient documentation

## 2010-07-11 DIAGNOSIS — M329 Systemic lupus erythematosus, unspecified: Secondary | ICD-10-CM | POA: Insufficient documentation

## 2010-07-11 DIAGNOSIS — E039 Hypothyroidism, unspecified: Secondary | ICD-10-CM | POA: Insufficient documentation

## 2010-07-11 DIAGNOSIS — Z79899 Other long term (current) drug therapy: Secondary | ICD-10-CM | POA: Insufficient documentation

## 2010-07-11 DIAGNOSIS — M069 Rheumatoid arthritis, unspecified: Secondary | ICD-10-CM | POA: Insufficient documentation

## 2010-07-11 DIAGNOSIS — I872 Venous insufficiency (chronic) (peripheral): Secondary | ICD-10-CM | POA: Insufficient documentation

## 2010-08-08 ENCOUNTER — Encounter (HOSPITAL_BASED_OUTPATIENT_CLINIC_OR_DEPARTMENT_OTHER): Payer: Medicare Other | Attending: Internal Medicine

## 2010-08-08 DIAGNOSIS — L97809 Non-pressure chronic ulcer of other part of unspecified lower leg with unspecified severity: Secondary | ICD-10-CM | POA: Insufficient documentation

## 2010-08-08 DIAGNOSIS — M069 Rheumatoid arthritis, unspecified: Secondary | ICD-10-CM | POA: Insufficient documentation

## 2010-08-23 ENCOUNTER — Other Ambulatory Visit: Payer: Self-pay | Admitting: Internal Medicine

## 2010-09-05 ENCOUNTER — Encounter (HOSPITAL_BASED_OUTPATIENT_CLINIC_OR_DEPARTMENT_OTHER): Payer: MEDICARE | Attending: Internal Medicine

## 2010-09-05 DIAGNOSIS — M069 Rheumatoid arthritis, unspecified: Secondary | ICD-10-CM | POA: Insufficient documentation

## 2010-09-05 DIAGNOSIS — L97809 Non-pressure chronic ulcer of other part of unspecified lower leg with unspecified severity: Secondary | ICD-10-CM | POA: Insufficient documentation

## 2010-10-25 ENCOUNTER — Telehealth: Payer: Self-pay | Admitting: Internal Medicine

## 2010-10-25 NOTE — Telephone Encounter (Signed)
Pt informed that is ok

## 2010-10-25 NOTE — Telephone Encounter (Signed)
Patient is not comfortable driving to [place], would like to switch to [facility], is it okay?

## 2010-10-25 NOTE — Telephone Encounter (Signed)
Ok already approved by pmd

## 2010-10-25 NOTE — Telephone Encounter (Signed)
No problem she would like dr Danise Edge

## 2010-11-16 ENCOUNTER — Other Ambulatory Visit: Payer: Self-pay | Admitting: Internal Medicine

## 2010-11-21 ENCOUNTER — Encounter: Payer: Self-pay | Admitting: Family Medicine

## 2010-11-21 ENCOUNTER — Ambulatory Visit (INDEPENDENT_AMBULATORY_CARE_PROVIDER_SITE_OTHER): Payer: MEDICARE | Admitting: Family Medicine

## 2010-11-21 VITALS — BP 147/87 | HR 93 | Temp 98.2°F | Ht 59.0 in | Wt 122.0 lb

## 2010-11-21 DIAGNOSIS — Z23 Encounter for immunization: Secondary | ICD-10-CM

## 2010-11-21 DIAGNOSIS — L2089 Other atopic dermatitis: Secondary | ICD-10-CM

## 2010-11-21 DIAGNOSIS — M069 Rheumatoid arthritis, unspecified: Secondary | ICD-10-CM

## 2010-11-21 DIAGNOSIS — E785 Hyperlipidemia, unspecified: Secondary | ICD-10-CM

## 2010-11-21 DIAGNOSIS — K219 Gastro-esophageal reflux disease without esophagitis: Secondary | ICD-10-CM

## 2010-11-21 DIAGNOSIS — F329 Major depressive disorder, single episode, unspecified: Secondary | ICD-10-CM

## 2010-11-21 DIAGNOSIS — Z0000 Encounter for general adult medical examination without abnormal findings: Secondary | ICD-10-CM

## 2010-11-21 DIAGNOSIS — J4489 Other specified chronic obstructive pulmonary disease: Secondary | ICD-10-CM

## 2010-11-21 DIAGNOSIS — E039 Hypothyroidism, unspecified: Secondary | ICD-10-CM

## 2010-11-21 DIAGNOSIS — D649 Anemia, unspecified: Secondary | ICD-10-CM

## 2010-11-21 DIAGNOSIS — J449 Chronic obstructive pulmonary disease, unspecified: Secondary | ICD-10-CM

## 2010-11-21 DIAGNOSIS — F3289 Other specified depressive episodes: Secondary | ICD-10-CM

## 2010-11-21 DIAGNOSIS — Q742 Other congenital malformations of lower limb(s), including pelvic girdle: Secondary | ICD-10-CM

## 2010-11-21 DIAGNOSIS — T887XXA Unspecified adverse effect of drug or medicament, initial encounter: Secondary | ICD-10-CM

## 2010-11-21 DIAGNOSIS — Z8619 Personal history of other infectious and parasitic diseases: Secondary | ICD-10-CM

## 2010-11-21 LAB — RENAL FUNCTION PANEL
Albumin: 3.5 g/dL (ref 3.5–5.2)
Calcium: 8.9 mg/dL (ref 8.4–10.5)
Creatinine, Ser: 0.7 mg/dL (ref 0.4–1.2)
Glucose, Bld: 83 mg/dL (ref 70–99)
Phosphorus: 3.2 mg/dL (ref 2.3–4.6)
Potassium: 4.2 mEq/L (ref 3.5–5.1)
Sodium: 140 mEq/L (ref 135–145)

## 2010-11-21 LAB — LIPID PANEL
Cholesterol: 218 mg/dL — ABNORMAL HIGH (ref 0–200)
HDL: 59 mg/dL (ref >39.00)
Triglycerides: 75 mg/dL (ref 0.0–149.0)
VLDL: 15 mg/dL (ref 0.0–40.0)

## 2010-11-21 LAB — HEPATIC FUNCTION PANEL
Albumin: 3.5 g/dL (ref 3.5–5.2)
Alkaline Phosphatase: 123 U/L — ABNORMAL HIGH (ref 39–117)
Total Protein: 7.2 g/dL (ref 6.0–8.3)

## 2010-11-21 LAB — CBC
MCH: 33.4 pg (ref 26.0–34.0)
MCV: 99.7 fL (ref 78.0–100.0)
Platelets: 155 10*3/uL (ref 150–400)
RBC: 3.8 MIL/uL — ABNORMAL LOW (ref 3.87–5.11)
RDW: 13.3 % (ref 11.5–15.5)

## 2010-11-21 LAB — TSH: TSH: 3.84 u[IU]/mL (ref 0.35–5.50)

## 2010-11-21 NOTE — Patient Instructions (Signed)

## 2010-11-21 NOTE — Assessment & Plan Note (Signed)
Uses Advair 100/50 1 puff po daily prn sob/cough  Will give some Albuterol to use prn cough/wheeze/sob

## 2010-11-21 NOTE — Assessment & Plan Note (Signed)
Takes Tums infrequently, less than once weekly

## 2010-11-21 NOTE — Assessment & Plan Note (Signed)
They believe the previous Embrel made this worse, is better off of it

## 2010-11-25 ENCOUNTER — Encounter: Payer: Self-pay | Admitting: Family Medicine

## 2010-11-25 DIAGNOSIS — Z8619 Personal history of other infectious and parasitic diseases: Secondary | ICD-10-CM

## 2010-11-25 HISTORY — DX: Personal history of other infectious and parasitic diseases: Z86.19

## 2010-11-25 NOTE — Progress Notes (Signed)
Kristen Lee [id number] [birth year] 11/25/2010      Progress Note New Patient  Subjective  Chief Complaint  Chief Complaint  Patient presents with  . Establish Care    new patient    HPI  Patient is a 70 year old Caucasian female who is in today to establish care. She has a complicated medical history but is doing well at the present time. She has been diagnosed with Sjogren's syndrome, lupus and rheumatoid arthritis. Follows with Dr. Iantha Fallen at work at [facility]. She follows with Dr. Dwain Sarna of gastroenterology for history of reflux. She follows with Dr. Martina Sinner for her eyes has glasses and has had laser surgery in the left eye. She uses Advair 100/50 one puff daily as needed when she has trouble with coughing. this is infrequent. Does not use albuterol routinely in fact does not have any at the present time. She did have trouble with persistent wart on her left leg this past year. Thyroid clinic from January to May before they closed. She shows scars but no open wounds. No chest pain, palpitations, shortness of breath, GI or GU complaints noted at this time.  Past Medical History  Diagnosis Date  . Thyroid disease   . Lupus   . Arthritis     rheumatoid and osteoarthritis  . Chicken pox as a child  . Shingles   . Measles as a child  . Mumps as a child  . History of shingles 11/25/2010    Past Surgical History  Procedure Date  . Abdominal hysterectomy     partial still has ovaries  . Tonsillectomy and adenoidectomy as a child  . Removal of blockage 02-03-10    Family History  Problem Relation Age of Onset  . Heart disease Mother   . Hyperlipidemia Mother   . Hypertension Mother   . Emphysema Father     smoked  . Heart attack Father     massive  . Heart disease Father     MI  . Other Son     shingles  . Cancer Paternal Grandmother     History   Social History  . Marital Status: Married    Spouse Name: N/A    Number of Children: N/A  . Years of Education:  N/A   Occupational History  . Not on file.   Social History Main Topics  . Smoking status: Former Smoker -- 2.0 packs/day for 15 years    Types: Cigarettes    Quit date: 02/03/1990  . Smokeless tobacco: Never Used  . Alcohol Use: No  . Drug Use: No  . Sexually Active: No   Other Topics Concern  . Not on file   Social History Narrative  . No narrative on file    Current Outpatient Prescriptions on File Prior to Visit  Medication Sig Dispense Refill  . DETROL LA 4 MG 24 hr capsule TAKE TWICE A DAY AS DIRECTED  180 capsule  2  . leflunomide (ARAVA) 20 MG tablet TAKE 1 TABLET ONCE DAILY  90 tablet  1  . levothyroxine (SYNTHROID, LEVOTHROID) 50 MCG tablet TAKE 1 TABLET DAILY  90 tablet  2  . LOVAZA 1 G capsule TAKE 2 CAPSULES TWICE A DAY (BRANDED FISH OIL)  360 capsule  2  . PREMARIN 0.625 MG tablet TAKE 1 TABLET DAILY  90 tablet  2  . venlafaxine (EFFEXOR-XR) 75 MG 24 hr capsule TAKE 1 CAPSULE ONCE DAILY  90 capsule  2    Allergies  Allergen Reactions  .  Penicillins     Review of Systems  Review of Systems  Constitutional: Negative for fever, chills and malaise/fatigue.  HENT: Negative for hearing loss, nosebleeds, congestion and sore throat.   Eyes: Negative for blurred vision, double vision, pain and discharge.  Respiratory: Positive for cough. Negative for sputum production, shortness of breath and wheezing.   Cardiovascular: Negative for chest pain, palpitations and leg swelling.  Gastrointestinal: Negative for heartburn, nausea, vomiting, abdominal pain, diarrhea, constipation and blood in stool.  Genitourinary: Negative for dysuria, urgency, frequency and hematuria.  Musculoskeletal: Negative for myalgias, back pain and falls.  Skin: Negative for rash.  Neurological: Negative for dizziness, tremors, sensory change, focal weakness, loss of consciousness, weakness and headaches.  Endo/Heme/Allergies: Negative for polydipsia. Does not bruise/bleed easily.    Psychiatric/Behavioral: Negative for depression and suicidal ideas. The patient is not nervous/anxious and does not have insomnia.     Objective  BP 147/87  Pulse 93  Temp(Src) 98.2 F (36.8 C) (Oral)  Ht 4\' 11"  (1.499 m)  Wt 122 lb (55.339 kg)  BMI 24.64 kg/m2  SpO2 97%  Physical Exam  Physical Exam  Constitutional: She is oriented to person, place, and time and well-developed, well-nourished, and in no distress. No distress.  HENT:  Head: Normocephalic and atraumatic.  Right Ear: External ear normal.  Left Ear: External ear normal.  Nose: Nose normal.  Mouth/Throat: Oropharynx is clear and moist. No oropharyngeal exudate.  Eyes: Conjunctivae are normal. Pupils are equal, round, and reactive to light. Right eye exhibits no discharge. Left eye exhibits no discharge. No scleral icterus.  Neck: Normal range of motion. Neck supple. No thyromegaly present.  Cardiovascular: Normal rate, regular rhythm, normal heart sounds and intact distal pulses.   No murmur heard. Pulmonary/Chest: Effort normal and breath sounds normal. No respiratory distress. She has no wheezes. She has no rales.  Abdominal: Soft. Bowel sounds are normal. She exhibits no distension and no mass. There is no tenderness.  Musculoskeletal: Normal range of motion. She exhibits no edema and no tenderness.  Lymphadenopathy:    She has no cervical adenopathy.  Neurological: She is alert and oriented to person, place, and time. She has normal reflexes. No cranial nerve deficit. Coordination normal.  Skin: Skin is warm and dry. No rash noted. She is not diaphoretic.       Several circular scars, lle  Psychiatric: Mood, memory and affect normal.       Assessment & Plan  COPD Uses Advair 100/50 1 puff po daily prn sob/cough  Will give some Albuterol to use prn cough/wheeze/sob  GERD Takes Tums infrequently, less than once weekly  [facility] ADVRS EFF UNS RX MEDICINAL&BIOLOGICAL SBSTNC They believe the previous  Embrel made this worse, is better off of it  History of shingles Patient requesting Zostavax. Had shingles over left Clavicle many years ago  Rheumatoid arthritis Labs drawn today will have them forwarded  DEPRESSION Doing well on current dose of Effexor no changes in meds today  HYPERLIPIDEMIA Tolerating Lovaza, avoid trans fats and lipid panel drawn today

## 2010-11-25 NOTE — Assessment & Plan Note (Signed)
Patient requesting Zostavax. Had shingles over left Clavicle many years ago

## 2010-11-25 NOTE — Assessment & Plan Note (Signed)
Tolerating Lovaza, avoid trans fats and lipid panel drawn today

## 2010-11-25 NOTE — Assessment & Plan Note (Signed)
Labs drawn today will have them forwarded

## 2010-11-25 NOTE — Assessment & Plan Note (Signed)
Doing well on current dose of Effexor no changes in meds today

## 2010-11-27 ENCOUNTER — Telehealth: Payer: Self-pay

## 2010-11-27 NOTE — Telephone Encounter (Signed)
[facility] [employer] (which is one company now) is requesting a refill for Detrol LA. Patient informed Diane she no longer wants this RX just the Lovaza. Per MD remove Detrol from med list (removed)

## 2010-11-28 ENCOUNTER — Telehealth: Payer: Self-pay

## 2010-11-28 NOTE — Telephone Encounter (Signed)
Per MD we are not going to be able to get the Lovaza approved. Please start taking 1 Megared cap daily and get Lipids done in 3-6 months.  I left a message on patients voicemail to return my call.

## 2010-12-02 ENCOUNTER — Other Ambulatory Visit: Payer: Self-pay | Admitting: Family Medicine

## 2010-12-02 DIAGNOSIS — Z1231 Encounter for screening mammogram for malignant neoplasm of breast: Secondary | ICD-10-CM

## 2010-12-02 NOTE — Telephone Encounter (Signed)
SW patient, advised her that MD could not get Lovaza approved, advised patient to get Megared cap instead

## 2010-12-19 ENCOUNTER — Encounter: Payer: Self-pay | Admitting: Family Medicine

## 2010-12-19 ENCOUNTER — Ambulatory Visit (INDEPENDENT_AMBULATORY_CARE_PROVIDER_SITE_OTHER): Payer: MEDICARE | Admitting: Family Medicine

## 2010-12-19 ENCOUNTER — Other Ambulatory Visit: Payer: Self-pay | Admitting: Internal Medicine

## 2010-12-19 VITALS — BP 150/80 | HR 80 | Temp 97.4°F | Ht 59.0 in | Wt 124.0 lb

## 2010-12-19 DIAGNOSIS — Z79899 Other long term (current) drug therapy: Secondary | ICD-10-CM

## 2010-12-19 DIAGNOSIS — E785 Hyperlipidemia, unspecified: Secondary | ICD-10-CM

## 2010-12-19 DIAGNOSIS — L259 Unspecified contact dermatitis, unspecified cause: Secondary | ICD-10-CM

## 2010-12-19 DIAGNOSIS — E039 Hypothyroidism, unspecified: Secondary | ICD-10-CM

## 2010-12-19 DIAGNOSIS — L309 Dermatitis, unspecified: Secondary | ICD-10-CM

## 2010-12-19 NOTE — Assessment & Plan Note (Addendum)
Patient with persistent pruritic rash on chest and arms, worse since her Shingles shot. We will refer her to dermatology for further evaluation and we will have her start Loratadine bid and use Witch Hazel Astringent and Sarna lotion prn

## 2010-12-19 NOTE — Assessment & Plan Note (Signed)
TSH WNL at last blood draw no change in therapy

## 2010-12-19 NOTE — Progress Notes (Signed)
Kristen Lee [id number] 09/02/1940 12/19/2010      Progress Note-Follow Up  Subjective  Chief Complaint  Chief Complaint  Patient presents with  . Follow-up    COPD    HPI  Patient is a 70 year old Caucasian female who is here for followup of her new patient appointment. She is generally feeling well but continues to struggle with the rash. It appeared that seemed shingles rash on her chest that resolved but when she took her shingles shot last month the rash returns with lesser degree. Is on her chest and on her arms it has been very raised and pruritic. It is improving at this point but still pruritic. No fevers, chills, chest pain, palpitations, shortness of breath, GI or GU complaints noted at this time. Her insurance refused to pay for Lovaza shows she has started over-the-counter fish oil.  Past Medical History  Diagnosis Date  . Thyroid disease   . Lupus   . Arthritis     rheumatoid and osteoarthritis  . Chicken pox as a child  . Shingles   . Measles as a child  . Mumps as a child  . History of shingles 11/25/2010    Past Surgical History  Procedure Date  . Abdominal hysterectomy     partial still has ovaries  . Tonsillectomy and adenoidectomy as a child  . Removal of blockage 02-03-10    Family History  Problem Relation Age of Onset  . Heart disease Mother   . Hyperlipidemia Mother   . Hypertension Mother   . Emphysema Father     smoked  . Heart attack Father     massive  . Heart disease Father     MI  . Other Son     shingles  . Cancer Paternal Grandmother     History   Social History  . Marital Status: Married    Spouse Name: N/A    Number of Children: N/A  . Years of Education: N/A   Occupational History  . Not on file.   Social History Main Topics  . Smoking status: Former Smoker -- 2.0 packs/day for 15 years    Types: Cigarettes    Quit date: 02/03/1990  . Smokeless tobacco: Never Used  . Alcohol Use: No  . Drug Use: No  .  Sexually Active: No   Other Topics Concern  . Not on file   Social History Narrative  . No narrative on file    Current Outpatient Prescriptions on File Prior to Visit  Medication Sig Dispense Refill  . calcium carbonate (OS-CAL) 600 MG TABS Take 600 mg by mouth daily.        . ferrous fumarate-b12-vitamic C-folic acid (TRINSICON / FOLTRIN) capsule Take 1 capsule by mouth 2 (two) times daily.        . folic acid (FOLVITE) 400 MCG tablet Take 400 mcg by mouth daily.        Marland Kitchen leflunomide (ARAVA) 20 MG tablet TAKE 1 TABLET ONCE DAILY  90 tablet  1  . levothyroxine (SYNTHROID, LEVOTHROID) 50 MCG tablet TAKE 1 TABLET DAILY  90 tablet  2  . LOVAZA 1 G capsule TAKE 2 CAPSULES TWICE A DAY (BRANDED FISH OIL)  360 capsule  2  . PREMARIN 0.625 MG tablet TAKE 1 TABLET DAILY  90 tablet  2  . traMADol (ULTRAM) 50 MG tablet       . venlafaxine (EFFEXOR-XR) 75 MG 24 hr capsule TAKE 1 CAPSULE ONCE DAILY  90 capsule  2    Allergies  Allergen Reactions  . Penicillins     Review of Systems  Review of Systems  Constitutional: Negative for fever and malaise/fatigue.  HENT: Negative for congestion.   Eyes: Negative for discharge.  Respiratory: Negative for shortness of breath.   Cardiovascular: Negative for chest pain, palpitations and leg swelling.  Gastrointestinal: Negative for nausea, abdominal pain and diarrhea.  Genitourinary: Negative for dysuria.  Musculoskeletal: Negative for falls.  Skin: Positive for itching and rash.  Neurological: Negative for loss of consciousness and headaches.  Endo/Heme/Allergies: Negative for polydipsia.  Psychiatric/Behavioral: Negative for depression and suicidal ideas. The patient is not nervous/anxious and does not have insomnia.     Objective  BP 150/80  Pulse 80  Temp(Src) 97.4 F (36.3 C) (Oral)  Ht 4\' 11"  (1.499 m)  Wt 124 lb (56.246 kg)  BMI 25.04 kg/m2  SpO2 96%  Physical Exam  Physical Exam  Constitutional: She is oriented to person,  place, and time and well-developed, well-nourished, and in no distress. No distress.  HENT:  Head: Normocephalic and atraumatic.  Eyes: Conjunctivae are normal.  Neck: Neck supple. No thyromegaly present.  Cardiovascular: Normal rate, regular rhythm and normal heart sounds.   No murmur heard. Pulmonary/Chest: Effort normal and breath sounds normal. She has no wheezes.  Abdominal: She exhibits no distension and no mass.  Musculoskeletal: She exhibits no edema.  Lymphadenopathy:    She has no cervical adenopathy.  Neurological: She is alert and oriented to person, place, and time.  Skin: Skin is warm and dry. No rash noted. She is not diaphoretic.  Psychiatric: Memory, affect and judgment normal.    Lab Results  Component Value Date   TSH 3.84 11/21/2010   Lab Results  Component Value Date   WBC 3.7* 11/21/2010   HGB 12.7 11/21/2010   HCT 37.9 11/21/2010   MCV 99.7 11/21/2010   PLT 155 11/21/2010   Lab Results  Component Value Date   CREATININE 0.7 11/21/2010   BUN 14 11/21/2010   NA 140 11/21/2010   K 4.2 11/21/2010   CL 103 11/21/2010   CO2 26 11/21/2010   Lab Results  Component Value Date   ALT 19 11/21/2010   AST 24 11/21/2010   ALKPHOS 123* 11/21/2010   BILITOT 0.7 11/21/2010   Lab Results  Component Value Date   CHOL 218* 11/21/2010   Lab Results  Component Value Date   HDL 59.00 11/21/2010   No results found for this basename: LDLCALC   Lab Results  Component Value Date   TRIG 75.0 11/21/2010   Lab Results  Component Value Date   CHOLHDL 4 11/21/2010     Assessment & Plan  Nonspecific (abnormal) findings on radiological and other examination of body structure Patient with persistent pruritic rash on chest and arms, worse since her Shingles shot. We will refer her to dermatology for further evaluation and we will have her start Loratadine bid and use Witch Hazel Astringent and Sarna lotion prn  HYPOTHYROIDISM TSH WNL at last blood draw no  change in therapy  HYPERLIPIDEMIA Patient's insurance has denied payment for Lovaza, we will have her switch to [facility] caps daily and recheck lipid panel in 3-6 months, avoid trans fats

## 2010-12-19 NOTE — Assessment & Plan Note (Signed)
Patient's insurance has denied payment for Lovaza, we will have her switch to [facility] caps daily and recheck lipid panel in 3-6 months, avoid trans fats

## 2010-12-19 NOTE — Patient Instructions (Signed)
Contact Dermatitis Contact dermatitis is a reaction to certain substances that touch the skin. Contact dermatitis can be either irritant contact dermatitis or allergic contact dermatitis. Irritant contact dermatitis does not require previous exposure to the substance for a reaction to occur.Allergic contact dermatitis only occurs if you have been exposed to the substance before. Upon a repeat exposure, your body reacts to the substance.  CAUSES  Many substances can cause contact dermatitis. Irritant dermatitis is most commonly caused by repeated exposure to mildly irritating substances, such as:  Makeup.   Soaps.   Detergents.   Bleaches.   Acids.   Metal salts, such as nickel.  Allergic contact dermatitis is most commonly caused by exposure to:  Poisonous plants.   Chemicals (deodorants, shampoos).   Jewelry.   Latex.   Neomycin in triple antibiotic cream.   Preservatives in products, including clothing.  SYMPTOMS  The area of skin that is exposed may develop:  Dryness or flaking.   Redness.   Cracks.   Itching.   Pain or a burning sensation.   Blisters.  With allergic contact dermatitis, there may also be swelling in areas such as the eyelids, mouth, or genitals.  DIAGNOSIS  Your caregiver can usually tell what the problem is by doing a physical exam. In cases where the cause is uncertain and an allergic contact dermatitis is suspected, a patch skin test may be performed to help determine the cause of your dermatitis. TREATMENT Treatment includes protecting the skin from further contact with the irritating substance by avoiding that substance if possible. Barrier creams, powders, and gloves may be helpful. Your caregiver may also recommend:  Steroid creams or ointments applied 2 times daily. For best results, soak the rash area in cool water for 20 minutes. Then apply the medicine. Cover the area with a plastic wrap. You can store the steroid cream in the  refrigerator for a "chilly" effect on your rash. That may decrease itching. Oral steroid medicines may be needed in more severe cases.   Antibiotics or antibacterial ointments if a skin infection is present.   Antihistamine lotion or an antihistamine taken by mouth to ease itching.   Lubricants to keep moisture in your skin.   Burow's solution to reduce redness and soreness or to dry a weeping rash. Mix one packet or tablet of solution in 2 cups cool water. Dip a clean washcloth in the mixture, wring it out a bit, and put it on the affected area. Leave the cloth in place for 30 minutes. Do this as often as possible throughout the day.   Taking several cornstarch or baking soda baths daily if the area is too large to cover with a washcloth.  Harsh chemicals, such as alkalis or acids, can cause skin damage that is like a burn. You should flush your skin for 15 to 20 minutes with cold water after such an exposure. You should also seek immediate medical care after exposure. Bandages (dressings), antibiotics, and pain medicine may be needed for severely irritated skin.  HOME CARE INSTRUCTIONS  Avoid the substance that caused your reaction.   Keep the area of skin that is affected away from hot water, soap, sunlight, chemicals, acidic substances, or anything else that would irritate your skin.   Do not scratch the rash. Scratching may cause the rash to become infected.   You may take cool baths to help stop the itching.   Only take over-the-counter or prescription medicines as directed by your caregiver.     See your caregiver for follow-up care as directed to make sure your skin is healing properly.  SEEK MEDICAL CARE IF:   Your condition is not better after 3 days of treatment.   You seem to be getting worse.   You see signs of infection such as swelling, tenderness, redness, soreness, or warmth in the affected area.   You have any problems related to your medicines.  Document Released:  01/18/2000 Document Revised: 10/02/2010 Document Reviewed: 06/25/2010 [facility] Patient Information 2012 [place], [place].  Start a Loratadone/Claritin 10mg  tab daily  Start [employer] AStringent as needed for itching and try Sarna anti itch lotion as well  For the cholesterol try MegaRed caps 1 daily by [employer]

## 2010-12-20 LAB — HEPATIC FUNCTION PANEL
ALT: 15 U/L (ref 0–35)
Albumin: 3.9 g/dL (ref 3.5–5.2)
Indirect Bilirubin: 0.5 mg/dL (ref 0.0–0.9)
Total Protein: 6.8 g/dL (ref 6.0–8.3)

## 2010-12-20 LAB — CBC
HCT: 40.1 % (ref 36.0–46.0)
MCH: 33.2 pg (ref 26.0–34.0)
MCHC: 33.2 g/dL (ref 30.0–36.0)
MCV: 100 fL (ref 78.0–100.0)
Platelets: 154 10*3/uL (ref 150–400)
RDW: 13.1 % (ref 11.5–15.5)

## 2010-12-23 NOTE — H&P (Signed)
  NAME:  [name], [name] NO.:  [id number]  MEDICAL RECORD NO.:  [id number]          PATIENT TYPE:  REC  LOCATION:  FOOT                         FACILITY:  [facility]  PHYSICIAN:  Ardath Sax, M.D.     DATE OF BIRTH:  July 07, 1940  DATE OF ADMISSION: DATE OF DISCHARGE:                             HISTORY & PHYSICAL   Kristen Lee is a 70 year old who has severe rheumatoid arthritis and very deformed feet and all the secondary changes in her hands.  She sees a rheumatoid specialist, and he has her on Enbrel and Arava, which he stopped after she started developing multiple ulcers on her legs, especially the left lateral calf.  He told the patient that Enbrel and Arava in high doses can sometimes cause skin ulcerations.  This lady is not a diabetic and has good pedal pulses.  The doctor prior to her coming here put her on Bactrim twice a day.  She is also on Premarin, Ferocon, tramadol, fish oil, Os-Cal, and folic acid.  The ulcer I debrided with a curette it is only about 2 cm in diameter, and I think it will heal up with proper treatment.  Today, we put her on Puracol, and she will come back in a week and we will see how it is.  We told her to change it every other day and to wash it well during dressing changes.  I do not think she really needs a vascular workup, as she has good pulses, and she has her doctor who tells her that this is a side effect of the medication that she was on, so we will use a Puracol and see how she does.  Other than her rheumatoid arthritis, she is really a very healthy woman who is quite active.     Ardath Sax, M.D.     PP/MEDQ  D:  02/13/2010  T:  02/14/2010  Job:  [id number]  Electronically Signed by Ardath Sax  on 12/23/2010 01:48:30 PM

## 2011-01-09 ENCOUNTER — Ambulatory Visit (HOSPITAL_COMMUNITY): Payer: MEDICARE

## 2011-01-15 ENCOUNTER — Other Ambulatory Visit: Payer: Self-pay | Admitting: Internal Medicine

## 2011-01-20 ENCOUNTER — Other Ambulatory Visit: Payer: Self-pay | Admitting: Internal Medicine

## 2011-03-03 ENCOUNTER — Telehealth: Payer: Self-pay | Admitting: Family Medicine

## 2011-03-03 NOTE — Telephone Encounter (Signed)
Bonnye, the pt states that per Dr. Shela Commons & you, she can come back to be his patient. I did switch it. She also states that she needs labs once a month CBC and hepatic I think? She wants to come this Thursday and do that. If this is ok, please put the orders in for what she needs. Thanks!

## 2011-03-03 NOTE — Telephone Encounter (Signed)
He will take back you can schedule cbc, and lft's thanks

## 2011-03-03 NOTE — Telephone Encounter (Signed)
Okey Regal, please disregard. I've done this. Thanks.

## 2011-03-06 ENCOUNTER — Other Ambulatory Visit (INDEPENDENT_AMBULATORY_CARE_PROVIDER_SITE_OTHER): Payer: MEDICARE

## 2011-03-06 DIAGNOSIS — D649 Anemia, unspecified: Secondary | ICD-10-CM

## 2011-03-06 DIAGNOSIS — T887XXA Unspecified adverse effect of drug or medicament, initial encounter: Secondary | ICD-10-CM

## 2011-03-06 LAB — CBC WITH DIFFERENTIAL/PLATELET
Basophils Absolute: 0 10*3/uL (ref 0.0–0.1)
Basophils Relative: 0.7 % (ref 0.0–3.0)
Eosinophils Absolute: 0.1 10*3/uL (ref 0.0–0.7)
Hemoglobin: 12.9 g/dL (ref 12.0–15.0)
Lymphocytes Relative: 23 % (ref 12.0–46.0)
MCHC: 33.2 g/dL (ref 30.0–36.0)
Monocytes Relative: 9.2 % (ref 3.0–12.0)
Neutrophils Relative %: 64.5 % (ref 43.0–77.0)
RBC: 3.79 Mil/uL — ABNORMAL LOW (ref 3.87–5.11)

## 2011-03-06 LAB — HEPATIC FUNCTION PANEL
AST: 20 U/L (ref 0–37)
Alkaline Phosphatase: 119 U/L — ABNORMAL HIGH (ref 39–117)
Bilirubin, Direct: 0 mg/dL (ref 0.0–0.3)
Total Protein: 7.2 g/dL (ref 6.0–8.3)

## 2011-03-11 ENCOUNTER — Other Ambulatory Visit: Payer: Self-pay | Admitting: *Deleted

## 2011-03-11 DIAGNOSIS — Q742 Other congenital malformations of lower limb(s), including pelvic girdle: Secondary | ICD-10-CM

## 2011-03-11 MED ORDER — TRAMADOL HCL 50 MG PO TABS: 50.0000 mg | ORAL_TABLET | Freq: Four times a day (QID) | ORAL | Status: DC | PRN

## 2011-03-19 ENCOUNTER — Ambulatory Visit: Payer: Medicare Other | Admitting: Family Medicine

## 2011-04-09 ENCOUNTER — Telehealth: Payer: Self-pay | Admitting: Internal Medicine

## 2011-04-09 DIAGNOSIS — D649 Anemia, unspecified: Secondary | ICD-10-CM

## 2011-04-09 MED ORDER — ESTROGENS CONJUGATED 0.625 MG PO TABS: 0.6250 mg | ORAL_TABLET | Freq: Every day | ORAL | Status: DC

## 2011-04-09 MED ORDER — FE FUMARATE-B12-VIT C-FA-IFC PO CAPS: 1.0000 | ORAL_CAPSULE | Freq: Two times a day (BID) | ORAL | Status: DC

## 2011-04-09 MED ORDER — LEVOTHYROXINE SODIUM 50 MCG PO TABS: 50.0000 ug | ORAL_TABLET | Freq: Every day | ORAL | Status: DC

## 2011-04-09 MED ORDER — LEFLUNOMIDE 20 MG PO TABS: 20.0000 mg | ORAL_TABLET | Freq: Every day | ORAL | Status: DC

## 2011-04-09 NOTE — Telephone Encounter (Signed)
Pt called and needs refill for Ferocon Caps 3 month supply, levothyroxine (SYNTHROID, LEVOTHROID) 50 MCG tablet ,leflunomide (ARAVA) 20 MG tablet, PREMARIN 0.625 MG tablet to [facility]).

## 2011-04-09 NOTE — Telephone Encounter (Signed)
Please call pt and tell her they have been sent int

## 2011-04-14 ENCOUNTER — Other Ambulatory Visit: Payer: Self-pay | Admitting: *Deleted

## 2011-04-14 DIAGNOSIS — D649 Anemia, unspecified: Secondary | ICD-10-CM

## 2011-04-14 MED ORDER — FE FUMARATE-B12-VIT C-FA-IFC PO CAPS: 1.0000 | ORAL_CAPSULE | Freq: Two times a day (BID) | ORAL | Status: DC

## 2011-04-23 DIAGNOSIS — M159 Polyosteoarthritis, unspecified: Secondary | ICD-10-CM | POA: Insufficient documentation

## 2011-04-23 DIAGNOSIS — M3500 Sicca syndrome, unspecified: Secondary | ICD-10-CM | POA: Insufficient documentation

## 2011-04-23 DIAGNOSIS — M069 Rheumatoid arthritis, unspecified: Secondary | ICD-10-CM | POA: Insufficient documentation

## 2011-05-20 ENCOUNTER — Encounter: Payer: Self-pay | Admitting: Family

## 2011-05-20 ENCOUNTER — Ambulatory Visit (INDEPENDENT_AMBULATORY_CARE_PROVIDER_SITE_OTHER): Payer: MEDICARE | Admitting: Family

## 2011-05-20 VITALS — BP 114/60 | Temp 98.2°F | Wt 122.0 lb

## 2011-05-20 DIAGNOSIS — L89899 Pressure ulcer of other site, unspecified stage: Secondary | ICD-10-CM

## 2011-05-20 DIAGNOSIS — M069 Rheumatoid arthritis, unspecified: Secondary | ICD-10-CM

## 2011-05-20 DIAGNOSIS — F329 Major depressive disorder, single episode, unspecified: Secondary | ICD-10-CM

## 2011-05-20 DIAGNOSIS — E785 Hyperlipidemia, unspecified: Secondary | ICD-10-CM

## 2011-05-20 DIAGNOSIS — L8990 Pressure ulcer of unspecified site, unspecified stage: Secondary | ICD-10-CM

## 2011-05-20 DIAGNOSIS — L8991 Pressure ulcer of unspecified site, stage 1: Secondary | ICD-10-CM

## 2011-05-20 DIAGNOSIS — L89891 Pressure ulcer of other site, stage 1: Secondary | ICD-10-CM

## 2011-05-20 DIAGNOSIS — E039 Hypothyroidism, unspecified: Secondary | ICD-10-CM

## 2011-05-20 LAB — CBC WITH DIFFERENTIAL/PLATELET
Basophils Absolute: 0 10*3/uL (ref 0.0–0.1)
Eosinophils Relative: 2.3 % (ref 0.0–5.0)
HCT: 38.6 % (ref 36.0–46.0)
Lymphs Abs: 1 10*3/uL (ref 0.7–4.0)
Monocytes Relative: 8.9 % (ref 3.0–12.0)
Neutrophils Relative %: 61.8 % (ref 43.0–77.0)
Platelets: 134 10*3/uL — ABNORMAL LOW (ref 150.0–400.0)
RDW: 13.9 % (ref 11.5–14.6)
WBC: 3.9 10*3/uL — ABNORMAL LOW (ref 4.5–10.5)

## 2011-05-20 LAB — HEPATIC FUNCTION PANEL
ALT: 15 U/L (ref 0–35)
AST: 21 U/L (ref 0–37)
Bilirubin, Direct: 0.1 mg/dL (ref 0.0–0.3)
Total Bilirubin: 0.6 mg/dL (ref 0.3–1.2)

## 2011-05-20 LAB — TSH: TSH: 3.49 u[IU]/mL (ref 0.35–5.50)

## 2011-05-20 MED ORDER — MUPIROCIN 2 % EX OINT: TOPICAL_OINTMENT | Freq: Three times a day (TID) | CUTANEOUS | Status: AC

## 2011-05-20 NOTE — Progress Notes (Signed)
Subjective:    Patient ID: Kristen Lee, female    DOB: January 25, 1941, 71 y.o.   MRN: [id number]  HPI 71 year old white female, nonsmoker, patient of 5 and today with complaints of a sore on the right buttocks and her right metatarsal. The 2 lesions appear to be healing well over the last 2 weeks. She denies any drainage or discharge. Hasn't been normal soreness. She has a history of rheumatoid arthritis. Patient is also here to have blood work drawn to be sent to her rheumatologist. She stable her current medications.   Review of Systems  Constitutional: Negative.   Respiratory: Negative.   Cardiovascular: Negative.   Gastrointestinal: Negative.   Musculoskeletal: Negative.   Skin: Positive for wound.       Right buttocks-healing ulcer.  Blanch as well. No drainage or discharge.Pressure ulcer noted to the second metatarsal, healing well. No drainage or discharge.  Hematological: Negative.   Psychiatric/Behavioral: Negative.    Past Medical History  Diagnosis Date  . Thyroid disease   . Lupus   . Arthritis     rheumatoid and osteoarthritis  . Chicken pox as a child  . Shingles   . Measles as a child  . Mumps as a child  . History of shingles 11/25/2010    History   Social History  . Marital Status: Married    Spouse Name: N/A    Number of Children: N/A  . Years of Education: N/A   Occupational History  . Not on file.   Social History Main Topics  . Smoking status: Former Smoker -- 2.0 packs/day for 15 years    Types: Cigarettes    Quit date: 02/03/1990  . Smokeless tobacco: Never Used  . Alcohol Use: No  . Drug Use: No  . Sexually Active: No   Other Topics Concern  . Not on file   Social History Narrative  . No narrative on file    Past Surgical History  Procedure Date  . Abdominal hysterectomy     partial still has ovaries  . Tonsillectomy and adenoidectomy as a child  . Removal of blockage 02-03-10    Family History  Problem Relation Age of  Onset  . Heart disease Mother   . Hyperlipidemia Mother   . Hypertension Mother   . Emphysema Father     smoked  . Heart attack Father     massive  . Heart disease Father     MI  . Other Son     shingles  . Cancer Paternal Grandmother     Allergies  Allergen Reactions  . Penicillins     Current Outpatient Prescriptions on File Prior to Visit  Medication Sig Dispense Refill  . calcium carbonate (OS-CAL) 600 MG TABS Take 600 mg by mouth daily.        Marland Kitchen DETROL LA 4 MG 24 hr capsule TAKE TWICE A DAY AS DIRECTED  180 capsule  1  . estrogens, conjugated, (PREMARIN) 0.625 MG tablet Take 1 tablet (0.625 mg total) by mouth daily. Take daily for 21 days then do not take for 7 days.  90 tablet  3  . ferrous fumarate-b12-vitamic C-folic acid (TRINSICON / FOLTRIN) capsule Take 1 capsule by mouth daily before breakfast.      . ferrous fumarate-b12-vitamic C-folic acid (TRINSICON / FOLTRIN) capsule Take 1 capsule by mouth 2 (two) times daily.  180 capsule  3  . folic acid (FOLVITE) 400 MCG tablet Take 400 mcg by mouth daily.        Marland Kitchen  leflunomide (ARAVA) 20 MG tablet Take 1 tablet (20 mg total) by mouth daily.  90 tablet  3  . levothyroxine (SYNTHROID, LEVOTHROID) 50 MCG tablet Take 1 tablet (50 mcg total) by mouth daily.  90 tablet  3  . LOVAZA 1 G capsule TAKE 2 CAPSULES TWICE A DAY (BRANDED FISH OIL)  360 capsule  2  . traMADol (ULTRAM) 50 MG tablet Take 1 tablet (50 mg total) by mouth every 6 (six) hours as needed for pain.  120 tablet  3  . venlafaxine (EFFEXOR-XR) 75 MG 24 hr capsule TAKE 1 CAPSULE ONCE DAILY  90 capsule  1    BP 114/60  Temp(Src) 98.2 F (36.8 C) (Oral)  Wt 122 lb (55.339 kg)chart    Objective:   Physical Exam  Constitutional: She is oriented to person, place, and time. She appears well-developed and well-nourished.  Neck: Normal range of motion. Neck supple.  Cardiovascular: Normal rate, regular rhythm and normal heart sounds.   Pulmonary/Chest: Effort normal and  breath sounds normal.  Abdominal: Bowel sounds are normal.  Neurological: She is alert and oriented to person, place, and time.  Skin: Skin is warm and dry.       Right buttocks-healing ulcer.  Blanch as well. No drainage or discharge.Pressure ulcer noted to the second metatarsal, healing well. No drainage or discharge.  Psychiatric: She has a normal mood and affect.          Assessment & Plan:  Assessment: Rheumatoid arthritis, pressure ulcer-healing well  Plan: Bactroban applied to the affected area 3 times a day. Warned against signs and symptoms of infection to include swelling, redness, discharge or drainage from the area. Patient verbalized understanding. Labs to include liver function tests and CBC Will fax to rheumatology. All the office if symptoms worsen or persist, return to appearing.

## 2011-06-19 ENCOUNTER — Encounter: Payer: Self-pay | Admitting: Family

## 2011-06-19 ENCOUNTER — Ambulatory Visit (INDEPENDENT_AMBULATORY_CARE_PROVIDER_SITE_OTHER): Payer: MEDICARE | Admitting: Family

## 2011-06-19 VITALS — BP 140/82 | HR 60 | Wt 123.0 lb

## 2011-06-19 DIAGNOSIS — F419 Anxiety disorder, unspecified: Secondary | ICD-10-CM

## 2011-06-19 DIAGNOSIS — R42 Dizziness and giddiness: Secondary | ICD-10-CM

## 2011-06-19 DIAGNOSIS — E039 Hypothyroidism, unspecified: Secondary | ICD-10-CM

## 2011-06-19 DIAGNOSIS — F411 Generalized anxiety disorder: Secondary | ICD-10-CM

## 2011-06-19 LAB — CBC WITH DIFFERENTIAL/PLATELET
Basophils Absolute: 0 10*3/uL (ref 0.0–0.1)
Basophils Relative: 0.5 % (ref 0.0–3.0)
Eosinophils Absolute: 0.1 10*3/uL (ref 0.0–0.7)
Hemoglobin: 12.8 g/dL (ref 12.0–15.0)
Lymphs Abs: 1 10*3/uL (ref 0.7–4.0)
MCHC: 33.3 g/dL (ref 30.0–36.0)
MCV: 101 fl — ABNORMAL HIGH (ref 78.0–100.0)
Monocytes Absolute: 0.4 10*3/uL (ref 0.1–1.0)
Neutro Abs: 2.3 10*3/uL (ref 1.4–7.7)
RBC: 3.8 Mil/uL — ABNORMAL LOW (ref 3.87–5.11)
RDW: 13.9 % (ref 11.5–14.6)

## 2011-06-19 LAB — BASIC METABOLIC PANEL
BUN: 13 mg/dL (ref 6–23)
CO2: 26 mEq/L (ref 19–32)
Glucose, Bld: 80 mg/dL (ref 70–99)
Potassium: 4.1 mEq/L (ref 3.5–5.1)
Sodium: 138 mEq/L (ref 135–145)

## 2011-06-19 MED ORDER — MECLIZINE HCL 12.5 MG PO TABS: 12.5000 mg | ORAL_TABLET | Freq: Three times a day (TID) | ORAL | Status: DC | PRN

## 2011-06-19 MED ORDER — MUPIROCIN 2 % EX OINT: TOPICAL_OINTMENT | Freq: Three times a day (TID) | CUTANEOUS | Status: DC

## 2011-06-19 NOTE — Progress Notes (Signed)
Addended byAdline Mango B on: 06/19/2011 01:09 PM   Modules accepted: Orders

## 2011-06-19 NOTE — Patient Instructions (Signed)

## 2011-06-19 NOTE — Progress Notes (Signed)
Subjective:    Patient ID: Kristen Lee, female    DOB: 21-Apr-1940, 71 y.o.   MRN: [id number]  HPI And 71 year old white female, nonsmoker, patient of Dr. Lovell Sheehan is in today with complaints of feeling off balance x2 days. Today, her symptoms have improved. She had an episode of feeling like her vision was blurred that lasted a couple hours and spontaneously resolved. She denies any sneezing, coughing, congestion, weakness, or fatigue, headaches, shortness of breath, chest pain, palpitations, or edema. No past medical history of any neurological deficits.   Review of Systems  Constitutional: Negative.   Respiratory: Negative.   Cardiovascular: Negative.   Gastrointestinal: Negative.   Genitourinary: Negative.   Musculoskeletal: Negative.   Neurological: Positive for light-headedness. Negative for dizziness, tremors, seizures, syncope, facial asymmetry, speech difficulty, weakness and headaches.  Hematological: Negative.   Psychiatric/Behavioral: Negative.    Past Medical History  Diagnosis Date  . Thyroid disease   . Lupus   . Arthritis     rheumatoid and osteoarthritis  . Chicken pox as a child  . Shingles   . Measles as a child  . Mumps as a child  . History of shingles 11/25/2010    History   Social History  . Marital Status: Married    Spouse Name: N/A    Number of Children: N/A  . Years of Education: N/A   Occupational History  . Not on file.   Social History Main Topics  . Smoking status: Former Smoker -- 2.0 packs/day for 15 years    Types: Cigarettes    Quit date: 02/03/1990  . Smokeless tobacco: Never Used  . Alcohol Use: No  . Drug Use: No  . Sexually Active: No   Other Topics Concern  . Not on file   Social History Narrative  . No narrative on file    Past Surgical History  Procedure Date  . Abdominal hysterectomy     partial still has ovaries  . Tonsillectomy and adenoidectomy as a child  . Removal of blockage 02-03-10    Family  History  Problem Relation Age of Onset  . Heart disease Mother   . Hyperlipidemia Mother   . Hypertension Mother   . Emphysema Father     smoked  . Heart attack Father     massive  . Heart disease Father     MI  . Other Son     shingles  . Cancer Paternal Grandmother     Allergies  Allergen Reactions  . Penicillins     Current Outpatient Prescriptions on File Prior to Visit  Medication Sig Dispense Refill  . calcium carbonate (OS-CAL) 600 MG TABS Take 600 mg by mouth daily.        Marland Kitchen DETROL LA 4 MG 24 hr capsule TAKE TWICE A DAY AS DIRECTED  180 capsule  1  . estrogens, conjugated, (PREMARIN) 0.625 MG tablet Take 1 tablet (0.625 mg total) by mouth daily. Take daily for 21 days then do not take for 7 days.  90 tablet  3  . ferrous fumarate-b12-vitamic C-folic acid (TRINSICON / FOLTRIN) capsule Take 1 capsule by mouth daily before breakfast.      . ferrous fumarate-b12-vitamic C-folic acid (TRINSICON / FOLTRIN) capsule Take 1 capsule by mouth 2 (two) times daily.  180 capsule  3  . folic acid (FOLVITE) 400 MCG tablet Take 400 mcg by mouth daily.        Marland Kitchen leflunomide (ARAVA) 20 MG tablet Take 1 tablet (  20 mg total) by mouth daily.  90 tablet  3  . levothyroxine (SYNTHROID, LEVOTHROID) 50 MCG tablet Take 1 tablet (50 mcg total) by mouth daily.  90 tablet  3  . LOVAZA 1 G capsule TAKE 2 CAPSULES TWICE A DAY (BRANDED FISH OIL)  360 capsule  2  . traMADol (ULTRAM) 50 MG tablet Take 1 tablet (50 mg total) by mouth every 6 (six) hours as needed for pain.  120 tablet  3  . venlafaxine (EFFEXOR-XR) 75 MG 24 hr capsule TAKE 1 CAPSULE ONCE DAILY  90 capsule  1    BP 140/82  Pulse 60  Wt 123 lb (55.792 kg)  SpO2 98%chart    Objective:   Physical Exam  Constitutional: She is oriented to person, place, and time. She appears well-developed and well-nourished.  HENT:  Right Ear: External ear normal.  Left Ear: External ear normal.  Nose: Nose normal.  Mouth/Throat: Oropharynx is clear  and moist.  Neck: Normal range of motion. Neck supple.  Cardiovascular: Normal rate, regular rhythm and normal heart sounds.   Pulmonary/Chest: Effort normal and breath sounds normal.  Abdominal: Soft. Bowel sounds are normal.  Musculoskeletal: Normal range of motion.  Neurological: She is alert and oriented to person, place, and time. She has normal reflexes. She displays normal reflexes. No cranial nerve deficit. She exhibits normal muscle tone. Coordination normal.  Skin: Skin is warm and dry.  Psychiatric: She has a normal mood and affect.      EKG: Within normal limits, no acute abnormality    Assessment & Plan:  Assessment: Dizziness  Plan: Lab sent to include BMP, CBC, and TSH motivation for the results. Antivert when necessary. Patient advised to call the office if her symptoms worsen or persist. Recheck as scheduled and sooner when necessary.

## 2011-06-24 ENCOUNTER — Observation Stay (HOSPITAL_COMMUNITY): Payer: MEDICARE

## 2011-06-24 ENCOUNTER — Inpatient Hospital Stay (HOSPITAL_COMMUNITY): Payer: MEDICARE

## 2011-06-24 ENCOUNTER — Inpatient Hospital Stay (HOSPITAL_COMMUNITY)
Admission: EM | Admit: 2011-06-24 | Discharge: 2011-06-25 | DRG: 066 | Disposition: A | Discharge status: Home Health | Payer: MEDICARE | Source: Ambulatory Visit | Attending: Internal Medicine | Admitting: Internal Medicine

## 2011-06-24 ENCOUNTER — Encounter (HOSPITAL_COMMUNITY): Payer: Self-pay

## 2011-06-24 DIAGNOSIS — M810 Age-related osteoporosis without current pathological fracture: Secondary | ICD-10-CM | POA: Diagnosis present

## 2011-06-24 DIAGNOSIS — G459 Transient cerebral ischemic attack, unspecified: Secondary | ICD-10-CM

## 2011-06-24 DIAGNOSIS — Z880 Allergy status to penicillin: Secondary | ICD-10-CM

## 2011-06-24 DIAGNOSIS — M069 Rheumatoid arthritis, unspecified: Secondary | ICD-10-CM

## 2011-06-24 DIAGNOSIS — Z79899 Other long term (current) drug therapy: Secondary | ICD-10-CM

## 2011-06-24 DIAGNOSIS — Z87891 Personal history of nicotine dependence: Secondary | ICD-10-CM

## 2011-06-24 DIAGNOSIS — D649 Anemia, unspecified: Secondary | ICD-10-CM

## 2011-06-24 DIAGNOSIS — R222 Localized swelling, mass and lump, trunk: Secondary | ICD-10-CM | POA: Diagnosis present

## 2011-06-24 DIAGNOSIS — L930 Discoid lupus erythematosus: Secondary | ICD-10-CM | POA: Diagnosis present

## 2011-06-24 DIAGNOSIS — J984 Other disorders of lung: Secondary | ICD-10-CM | POA: Diagnosis present

## 2011-06-24 DIAGNOSIS — I6340 Cerebral infarction due to embolism of unspecified cerebral artery: Principal | ICD-10-CM | POA: Diagnosis present

## 2011-06-24 DIAGNOSIS — I059 Rheumatic mitral valve disease, unspecified: Secondary | ICD-10-CM

## 2011-06-24 DIAGNOSIS — E039 Hypothyroidism, unspecified: Secondary | ICD-10-CM | POA: Diagnosis present

## 2011-06-24 DIAGNOSIS — G119 Hereditary ataxia, unspecified: Secondary | ICD-10-CM

## 2011-06-24 DIAGNOSIS — I6990 Unspecified sequelae of unspecified cerebrovascular disease: Secondary | ICD-10-CM | POA: Diagnosis present

## 2011-06-24 DIAGNOSIS — E785 Hyperlipidemia, unspecified: Secondary | ICD-10-CM | POA: Diagnosis present

## 2011-06-24 DIAGNOSIS — Z7982 Long term (current) use of aspirin: Secondary | ICD-10-CM

## 2011-06-24 DIAGNOSIS — I639 Cerebral infarction, unspecified: Secondary | ICD-10-CM

## 2011-06-24 LAB — URINALYSIS, ROUTINE W REFLEX MICROSCOPIC
Leukocytes, UA: NEGATIVE
Nitrite: NEGATIVE
Protein, ur: NEGATIVE mg/dL
Specific Gravity, Urine: 1.006 (ref 1.005–1.030)
Urobilinogen, UA: 0.2 mg/dL (ref 0.0–1.0)

## 2011-06-24 LAB — COMPREHENSIVE METABOLIC PANEL
AST: 16 U/L (ref 0–37)
Albumin: 3.2 g/dL — ABNORMAL LOW (ref 3.5–5.2)
Alkaline Phosphatase: 130 U/L — ABNORMAL HIGH (ref 39–117)
BUN: 12 mg/dL (ref 6–23)
Chloride: 99 mEq/L (ref 96–112)
Creatinine, Ser: 0.66 mg/dL (ref 0.50–1.10)
Potassium: 4.3 mEq/L (ref 3.5–5.1)
Total Bilirubin: 0.6 mg/dL (ref 0.3–1.2)
Total Protein: 6.9 g/dL (ref 6.0–8.3)

## 2011-06-24 LAB — CBC
HCT: 39.4 % (ref 36.0–46.0)
MCHC: 33.5 g/dL (ref 30.0–36.0)
Platelets: 147 10*3/uL — ABNORMAL LOW (ref 150–400)
RDW: 13.3 % (ref 11.5–15.5)
WBC: 3.6 10*3/uL — ABNORMAL LOW (ref 4.0–10.5)

## 2011-06-24 LAB — LIPID PANEL
Cholesterol: 212 mg/dL — ABNORMAL HIGH (ref 0–200)
Triglycerides: 131 mg/dL (ref <150)

## 2011-06-24 LAB — PROTIME-INR
INR: 1.12 (ref 0.00–1.49)
Prothrombin Time: 14.6 seconds (ref 11.6–15.2)

## 2011-06-24 LAB — HEMOGLOBIN A1C: Mean Plasma Glucose: 103 mg/dL (ref <117)

## 2011-06-24 MED ORDER — MUPIROCIN 2 % EX OINT
1.0000 "application " | TOPICAL_OINTMENT | Freq: Three times a day (TID) | CUTANEOUS | Status: DC
  Administered 2011-06-24 – 2011-06-25 (×3): 1 via TOPICAL
  Filled 2011-06-24: qty 22

## 2011-06-24 MED ORDER — FLUTICASONE-SALMETEROL 100-50 MCG/DOSE IN AEPB
1.0000 | INHALATION_SPRAY | Freq: Two times a day (BID) | RESPIRATORY_TRACT | Status: DC
Start: 2011-06-24 — End: 2011-06-25
  Administered 2011-06-24 – 2011-06-25 (×2): 1 via RESPIRATORY_TRACT
  Filled 2011-06-24: qty 14

## 2011-06-24 MED ORDER — CALCIUM CARBONATE 600 MG PO TABS
600.0000 mg | ORAL_TABLET | Freq: Every day | ORAL | Status: DC
  Filled 2011-06-24 (×2): qty 1

## 2011-06-24 MED ORDER — FOLIC ACID 0.5 MG HALF TAB
0.5000 mg | ORAL_TABLET | Freq: Every day | ORAL | Status: DC
  Administered 2011-06-24 – 2011-06-25 (×2): 0.5 mg via ORAL
  Filled 2011-06-24 (×2): qty 1

## 2011-06-24 MED ORDER — LEVOTHYROXINE SODIUM 50 MCG PO TABS
50.0000 ug | ORAL_TABLET | Freq: Every day | ORAL | Status: DC
  Filled 2011-06-24 (×2): qty 1

## 2011-06-24 MED ORDER — ENOXAPARIN SODIUM 40 MG/0.4ML ~~LOC~~ SOLN
40.0000 mg | Freq: Every day | SUBCUTANEOUS | Status: DC
  Administered 2011-06-24 – 2011-06-25 (×2): 40 mg via SUBCUTANEOUS
  Filled 2011-06-24 (×2): qty 0.4

## 2011-06-24 MED ORDER — ASPIRIN 325 MG PO TABS
325.0000 mg | ORAL_TABLET | Freq: Every day | ORAL | Status: DC
  Administered 2011-06-25: 325 mg via ORAL
  Filled 2011-06-24: qty 1

## 2011-06-24 MED ORDER — VENLAFAXINE HCL ER 75 MG PO CP24
75.0000 mg | ORAL_CAPSULE | Freq: Every day | ORAL | Status: DC
  Administered 2011-06-24 – 2011-06-25 (×2): 75 mg via ORAL
  Filled 2011-06-24 (×3): qty 1

## 2011-06-24 MED ORDER — ASPIRIN 300 MG RE SUPP: 300.0000 mg | Freq: Every day | RECTAL | Status: DC

## 2011-06-24 MED ORDER — MECLIZINE HCL 25 MG PO TABS
25.0000 mg | ORAL_TABLET | Freq: Once | ORAL | Status: AC
  Administered 2011-06-24: 25 mg via ORAL
  Filled 2011-06-24: qty 1

## 2011-06-24 MED ORDER — SENNOSIDES-DOCUSATE SODIUM 8.6-50 MG PO TABS
1.0000 | ORAL_TABLET | Freq: Every evening | ORAL | Status: DC | PRN
  Administered 2011-06-25: 1 via ORAL
  Filled 2011-06-24: qty 1

## 2011-06-24 MED ORDER — LEFLUNOMIDE 20 MG PO TABS
20.0000 mg | ORAL_TABLET | Freq: Every day | ORAL | Status: DC
  Administered 2011-06-24 – 2011-06-25 (×2): 20 mg via ORAL
  Filled 2011-06-24 (×2): qty 1

## 2011-06-24 MED ORDER — ASPIRIN 325 MG PO TABS
325.0000 mg | ORAL_TABLET | Freq: Every day | ORAL | Status: DC
Start: 2011-06-24 — End: 2011-06-24
  Administered 2011-06-24: 325 mg via ORAL
  Filled 2011-06-24: qty 1

## 2011-06-24 MED ORDER — OMEGA-3-ACID ETHYL ESTERS 1 G PO CAPS
2.0000 g | ORAL_CAPSULE | Freq: Two times a day (BID) | ORAL | Status: DC
  Administered 2011-06-24 – 2011-06-25 (×2): 2 g via ORAL
  Filled 2011-06-24 (×3): qty 2

## 2011-06-24 MED ORDER — FOLIC ACID 400 MCG PO TABS: 400.0000 ug | ORAL_TABLET | Freq: Every day | ORAL | Status: DC

## 2011-06-24 MED ORDER — ASPIRIN 300 MG RE SUPP
300.0000 mg | Freq: Every day | RECTAL | Status: DC
Start: 2011-06-25 — End: 2011-06-25
  Filled 2011-06-24: qty 1

## 2011-06-24 MED ORDER — ONDANSETRON HCL 4 MG/2ML IJ SOLN: 4.0000 mg | Freq: Four times a day (QID) | INTRAMUSCULAR | Status: DC | PRN

## 2011-06-24 MED ORDER — FESOTERODINE FUMARATE ER 8 MG PO TB24
8.0000 mg | ORAL_TABLET | Freq: Every day | ORAL | Status: DC
  Administered 2011-06-24 – 2011-06-25 (×2): 8 mg via ORAL
  Filled 2011-06-24 (×2): qty 1

## 2011-06-24 NOTE — ED Provider Notes (Signed)
71 year old female has been having intermittent episodes of dizziness and being off-balance for the last week. She saw her PCP who had recommended meclizine on an as-needed basis. Last night, she fell twice. When she falls, as it is always to the left. On exam, she has no nystagmus, no carotid bruits. Lungs are clear and heart has regular rate and rhythm without murmur. Neurologic exam is unremarkable although gait was not tested. She will be evaluated for possible stroke with CT and MRI and if negative will be placed on TIA protocol.   Date: 06/24/2011  Rate: 83  Rhythm: normal sinus rhythm and premature atrial contractions (PAC)  QRS Axis: left  Intervals: normal  ST/T Wave abnormalities: normal  Conduction Disutrbances:none  Narrative Interpretation: Low QRS voltage, left axis deviation. No old ECG available for comparison.  Old EKG Reviewed: none available    Dione Booze, MD 06/24/11 1012

## 2011-06-24 NOTE — ED Notes (Signed)
Neurology notified of MRI results.

## 2011-06-24 NOTE — ED Provider Notes (Signed)
History     CSN: [id number]  Arrival date & time 06/24/11  4098   First MD Initiated Contact with Patient 06/24/11 [phone]      Chief Complaint  Patient presents with  . Fall  . Dizziness    (Consider location/radiation/quality/duration/timing/severity/associated sxs/prior Treatment)  HPI  Patient presents to emergency department complaining of a one-week history of intermittent lightheadedness, feeling off balance, and blurred vision. Patient states that she went to her primary care providers office last week, Dr. Jonny Ruiz Jenkin's office and saw his nurse practitioner for evaluation of similar symptoms but states "I don't really know what came of that visit." Patient states that she woke last night at 2 AM to use the bathroom and states that she was walking to the bathroom she felt very off balance like she was falling towards the left and caught herself at the bathtub. She denies hitting her head or falling completely to the ground. Patient states she fell into the bathtub but caught herself stating "I don't think I hurt anything." Patient states that she has since walked but with assistance without any pain but states "I still feel very off balance like I have to be helped." She denies hitting her head, loss of consciousness, neck pain, chest pain, shortness of breath, unilateral weakness, slurred speech, drooling, facial droop, extremity numbness or tingling, back pain, or injury to her extremities. She denies aggravating or alleviating factors.  Past Medical History  Diagnosis Date  . Thyroid disease   . Lupus   . Arthritis     rheumatoid and osteoarthritis  . Chicken pox as a child  . Shingles   . Measles as a child  . Mumps as a child  . History of shingles 11/25/2010    Past Surgical History  Procedure Date  . Abdominal hysterectomy     partial still has ovaries  . Tonsillectomy and adenoidectomy as a child  . Removal of blockage 02-03-10    Family History  Problem Relation  Age of Onset  . Heart disease Mother   . Hyperlipidemia Mother   . Hypertension Mother   . Emphysema Father     smoked  . Heart attack Father     massive  . Heart disease Father     MI  . Other Son     shingles  . Cancer Paternal Grandmother     History  Substance Use Topics  . Smoking status: Former Smoker -- 2.0 packs/day for 15 years    Types: Cigarettes    Quit date: 02/03/1990  . Smokeless tobacco: Never Used  . Alcohol Use: No    OB History    Grav Para Term Preterm Abortions TAB SAB Ect Mult Living                  Review of Systems  All other systems reviewed and are negative.    Allergies  Penicillins  Home Medications   Current Outpatient Rx  Name Route Sig Dispense Refill  . CALCIUM CARBONATE 600 MG PO TABS Oral Take 600 mg by mouth daily.      Marland Kitchen ESTROGENS CONJUGATED 0.625 MG PO TABS Oral Take 0.625 mg by mouth daily.    . FE FUMARATE-B12-VIT C-FA-IFC PO CAPS Oral Take 1 capsule by mouth daily before breakfast.    . FE FUMARATE-B12-VIT C-FA-IFC PO CAPS Oral Take 1 capsule by mouth 2 (two) times daily.    Marland Kitchen FLUTICASONE-SALMETEROL 100-50 MCG/DOSE IN AEPB Inhalation Inhale 1 puff into the  lungs every 12 (twelve) hours.    Marland Kitchen FOLIC ACID 400 MCG PO TABS Oral Take 400 mcg by mouth daily.      Marland Kitchen LEFLUNOMIDE 20 MG PO TABS Oral Take 20 mg by mouth daily.    Marland Kitchen LEVOTHYROXINE SODIUM 50 MCG PO TABS Oral Take 50 mcg by mouth daily.    Marland Kitchen MUPIROCIN 2 % EX OINT Topical Apply 1 application topically 3 (three) times daily.    . OMEGA-3-ACID ETHYL ESTERS 1 G PO CAPS Oral Take 2 g by mouth 2 (two) times daily.    . TOLTERODINE TARTRATE ER 4 MG PO CP24 Oral Take 4 mg by mouth 2 (two) times daily.     . TRAMADOL HCL 50 MG PO TABS Oral Take 50 mg by mouth every 6 (six) hours as needed. For pain    . VENLAFAXINE HCL ER 75 MG PO CP24 Oral Take 75 mg by mouth daily.      BP 160/112  Pulse 79  Temp(Src) 97.6 F (36.4 C) (Oral)  Resp 16  SpO2 98%  Physical Exam  Nursing  note and vitals reviewed. Constitutional: She is oriented to person, place, and time. She appears well-developed and well-nourished. No distress.  HENT:  Head: Normocephalic and atraumatic.  Eyes: Conjunctivae and EOM are normal. Pupils are equal, round, and reactive to light.  Neck: Normal range of motion. Neck supple.       No carotid bruits  Cardiovascular: Normal rate, regular rhythm, normal heart sounds and intact distal pulses.  Exam reveals no gallop and no friction rub.   No murmur heard. Pulmonary/Chest: Effort normal and breath sounds normal. No respiratory distress. She has no wheezes. She has no rales. She exhibits no tenderness.  Abdominal: Soft. Bowel sounds are normal. She exhibits no distension and no mass. There is no tenderness. There is no rebound and no guarding.  Musculoskeletal: Normal range of motion. She exhibits no edema and no tenderness.  Neurological: She is alert and oriented to person, place, and time. No cranial nerve deficit. Coordination normal.       Normal finger to nose and heel to shin  Gait not evaluated.   Skin: Skin is warm and dry. No rash noted. She is not diaphoretic. No erythema.  Psychiatric: She has a normal mood and affect.    ED Course  Procedures (including critical care time)  PO meclizine  Labs Reviewed  CBC - Abnormal; Notable for the following:    WBC 3.6 (*)    Platelets 147 (*)    All other components within normal limits  COMPREHENSIVE METABOLIC PANEL - Abnormal; Notable for the following:    Albumin 3.2 (*)    Alkaline Phosphatase 130 (*)    GFR calc non Af Amer 86 (*)    All other components within normal limits  LIPID PANEL - Abnormal; Notable for the following:    Cholesterol 212 (*)    LDL Cholesterol 129 (*)    All other components within normal limits  PROTIME-INR  APTT  URINALYSIS, ROUTINE W REFLEX MICROSCOPIC  HEMOGLOBIN A1C   Mr Brain Wo Contrast  06/24/2011  *RADIOLOGY REPORT*  Clinical Data:  Acute left  sided numbness and weakness. Hyperlipidemia.  MRI HEAD WITHOUT CONTRAST MRA HEAD WITHOUT CONTRAST  Technique: Multiplanar, multiecho pulse sequences of the brain and surrounding structures were obtained according to standard protocol without intravenous contrast.  Angiographic images of the head were obtained using MRA technique without contrast.  Comparison: None.  MRI  HEAD  Findings:  Small acute non hemorrhagic infarcts anterior right pons and mid right paracentral pontine region.  Several small remote infarcts within the cerebellum bilaterally.  Moderate small vessel disease type changes.  Global atrophy without hydrocephalus.  No intracranial hemorrhage.  No intracranial mass lesion detected on this unenhanced exam.  Partial opacification left mastoid air cells.  No obstructing lesion causing eustachian tube dysfunction detected.  Partial opacification/mucosal thickening ethmoid sinus air cells.  Mild transverse ligament hypertrophy.  Mild cervical spondylotic changes C3-4 and C4-5.  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  Several small remote infarcts within the cerebellum bilaterally.  Moderate small vessel disease type changes.  Global atrophy without hydrocephalus.  Partial opacification left mastoid air cells.  MRA HEAD  Findings: Ectatic irregular cavernous and supraclinoid segment of the internal carotid artery bilaterally with there is mild narrowing but without high-grade stenosis.  Ectatic A1 segment of the right anterior cerebral artery.  Middle cerebral artery and A2 segment anterior cerebral artery branch vessel mild to slightly moderate irregularity and narrowing.  Right vertebral artery is dominant and ectatic with mild irregularity.  Poor delineation right PICA.  Moderate long segment narrowing of the distal left vertebral artery.  Ectatic basilar artery with mild irregularity without high-grade stenosis.  Poor delineation  left AICA.  Mild ectasia posterior cerebral arteries with distal branch vessel irregularity and narrowing.  Mild bulge distal M1 segment right middle cerebral artery from which a vessel arises without discrete saccular aneurysm or vascular malformation.  IMPRESSION: Intracranial atherosclerotic type changes as detailed above.  Original Report Authenticated By: Fuller Canada, M.D.   Mr Mra Head/brain Wo Cm  06/24/2011  *RADIOLOGY REPORT*  Clinical Data:  Acute left sided numbness and weakness. Hyperlipidemia.  MRI HEAD WITHOUT CONTRAST MRA HEAD WITHOUT CONTRAST  Technique: Multiplanar, multiecho pulse sequences of the brain and surrounding structures were obtained according to standard protocol without intravenous contrast.  Angiographic images of the head were obtained using MRA technique without contrast.  Comparison: None.  MRI HEAD  Findings:  Small acute non hemorrhagic infarcts anterior right pons and mid right paracentral pontine region.  Several small remote infarcts within the cerebellum bilaterally.  Moderate small vessel disease type changes.  Global atrophy without hydrocephalus.  No intracranial hemorrhage.  No intracranial mass lesion detected on this unenhanced exam.  Partial opacification left mastoid air cells.  No obstructing lesion causing eustachian tube dysfunction detected.  Partial opacification/mucosal thickening ethmoid sinus air cells.  Mild transverse ligament hypertrophy.  Mild cervical spondylotic changes C3-4 and C4-5.  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  Several small remote infarcts within the cerebellum bilaterally.  Moderate small vessel disease type changes.  Global atrophy without hydrocephalus.  Partial opacification left mastoid air cells.  MRA HEAD  Findings: Ectatic irregular cavernous and supraclinoid segment of the internal carotid artery bilaterally with there is mild narrowing but without  high-grade stenosis.  Ectatic A1 segment of the right anterior cerebral artery.  Middle cerebral artery and A2 segment anterior cerebral artery branch vessel mild to slightly moderate irregularity and narrowing.  Right vertebral artery is dominant and ectatic with mild irregularity.  Poor delineation right PICA.  Moderate long segment narrowing of the distal left vertebral artery.  Ectatic basilar artery with mild irregularity without high-grade stenosis.  Poor delineation left AICA.  Mild ectasia posterior cerebral arteries with distal branch vessel irregularity and  narrowing.  Mild bulge distal M1 segment right middle cerebral artery from which a vessel arises without discrete saccular aneurysm or vascular malformation.  IMPRESSION: Intracranial atherosclerotic type changes as detailed above.  Original Report Authenticated By: Fuller Canada, M.D.     1. Stroke   2. Osteoporosis   3. Anemia       MDM  Sign out given to Vedia Pereyra who will dispo pending labs and imaging.         [place], [place] 06/24/11 1601

## 2011-06-24 NOTE — Progress Notes (Signed)
  Echocardiogram 2D Echocardiogram has been performed.  Kristen Lee A 06/24/2011, 11:36 AM

## 2011-06-24 NOTE — ED Provider Notes (Signed)
She has been comfortable, still has residual symptoms of numbness/tingling in left upper and lower extremities. MRI shows acute Pons infarct. [facility] called for admission. Results and plan discussed with patient and family.  Rodena Medin, PA-C 06/24/11 1451

## 2011-06-24 NOTE — ED Notes (Signed)
Patient remains in vascular- husband given meal tray and drink.  No other needs expressed by family

## 2011-06-24 NOTE — Consult Note (Signed)
[facility] CONSULT NOTE     Reason for Consult: stroke   HPI:    Kristen Lee is an 71 y.o. female who noted last Wednesday that she felt "woozy" in her head and off balance for most of the day.  This was most notable when she was standing.  Her symptoms stopped by the next day. Due to these symptoms she made a appointment with her primary care MD who obtained a 12 lead ECG which was normal. Patient woke up this morning at 2 am feeling significantly off balance and tended to veer to the left per husband.  When she awoke at 6 her symptoms persisted and she was brought to the ED.  Patient has never been on ASA and does not recall having high cholesterol.   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  Several small remote infarcts within the cerebellum bilaterally.  Moderate small vessel disease type changes".  MRA head showed "Right vertebral artery is dominant and ectatic with mild irregularity.  Poor delineation right PICA.  Moderate long segment narrowing of the distal left vertebral artery.  Ectatic basilar artery with mild irregularity without high-grade stenosis. "    At present time she continues to have decreased sensation in left foot and hand.    Past Medical History  Diagnosis Date  . Thyroid disease   . Lupus   . Arthritis     rheumatoid and osteoarthritis  . Chicken pox as a child  . Shingles   . Measles as a child  . Mumps as a child  . History of shingles 11/25/2010    Past Surgical History  Procedure Date  . Abdominal hysterectomy     partial still has ovaries  . Tonsillectomy and adenoidectomy as a child  . Removal of blockage 02-03-10    Family History  Problem Relation Age of Onset  . Heart disease Mother   . Hyperlipidemia Mother   . Hypertension Mother   . Emphysema Father     smoked  . Heart attack Father     massive  . Heart disease Father      MI  . Other Son     shingles  . Cancer Paternal Grandmother     Social History:  reports that she quit smoking about 21 years ago. Her smoking use included Cigarettes. She has a 30 pack-year smoking history. She has never used smokeless tobacco. She reports that she does not drink alcohol or use illicit drugs.  Allergies  Allergen Reactions  . Penicillins Swelling    Medications:    Prior to Admission medications   Medication Sig Start Date End Date Taking? Authorizing Provider  calcium carbonate (OS-CAL) 600 MG TABS Take 600 mg by mouth daily.     Yes Historical Provider, MD  estrogens, conjugated, (PREMARIN) 0.625 MG tablet Take 0.625 mg by mouth daily. 04/09/11  Yes Stacie Glaze, MD  ferrous fumarate-b12-vitamic C-folic acid (TRINSICON / FOLTRIN) capsule Take 1 capsule by mouth daily before breakfast.   Yes Historical Provider, MD  ferrous fumarate-b12-vitamic C-folic acid (TRINSICON / FOLTRIN) capsule Take 1 capsule by mouth 2 (two) times daily. 04/14/11 04/13/12 Yes Stacie Glaze, MD  Fluticasone-Salmeterol (ADVAIR) 100-50 MCG/DOSE AEPB Inhale 1 puff into the lungs every 12 (twelve) hours.   Yes Historical Provider,  MD  folic acid (FOLVITE) 400 MCG tablet Take 400 mcg by mouth daily.     Yes Historical Provider, MD  leflunomide (ARAVA) 20 MG tablet Take 20 mg by mouth daily. 04/09/11  Yes Stacie Glaze, MD  levothyroxine (SYNTHROID, LEVOTHROID) 50 MCG tablet Take 50 mcg by mouth daily. 04/09/11  Yes Stacie Glaze, MD  mupirocin ointment (BACTROBAN) 2 % Apply 1 application topically 3 (three) times daily. 06/19/11 06/26/11 Yes Baker Pierini, FNP  omega-3 acid ethyl esters (LOVAZA) 1 G capsule Take 2 g by mouth 2 (two) times daily.   Yes Historical Provider, MD  tolterodine (DETROL LA) 4 MG 24 hr capsule Take 4 mg by mouth 2 (two) times daily.    Yes Historical Provider, MD  traMADol (ULTRAM) 50 MG tablet Take 50 mg by mouth every 6 (six) hours as needed. For pain 03/11/11  Yes Stacie Glaze, MD  venlafaxine XR (EFFEXOR-XR) 75 MG 24 hr capsule Take 75 mg by mouth daily.   Yes Historical Provider, MD    Scheduled:   . aspirin  325 mg Oral Daily  . meclizine  25 mg Oral Once    Review of Systems - General ROS: negative for - chills, fatigue, fever or hot flashes Hematological and Lymphatic ROS: negative for - bruising, fatigue, jaundice or pallor Endocrine ROS: negative for - hair pattern changes, hot flashes, mood swings or skin changes Respiratory ROS: negative for - cough, hemoptysis, orthopnea or wheezing Cardiovascular ROS: negative for - dyspnea on exertion, orthopnea, palpitations or shortness of breath Gastrointestinal ROS: negative for - abdominal pain, appetite loss, blood in stools, diarrhea or hematemesis Musculoskeletal WUJ:[id number] for - joint pain, joint stiffness, joint swelling or muscle pain Neurological ROS: positive for - dizziness and numbness/tingling Dermatological ROS: negative for dry skin, pruritus and rash   Blood pressure 170/80, pulse 80, temperature 97.6 F (36.4 C), temperature source Oral, resp. rate 20, SpO2 98.00%.   Neurologic Examination:   Mental Status: Alert, oriented, thought content appropriate.  Speech fluent without evidence of aphasia. Able to follow 3 step commands without difficulty. Cranial Nerves: II-Visual fields grossly intact. III/IV/VI-Extraocular movements intact.  Pupils reactive bilaterally. V/VII-Smile symmetric VIII-grossly intact IX/X-normal gag XI-bilateral shoulder shrug XII-midline tongue extension Motor: 4/5 bilaterally with normal tone and bulk (left shoulder abduction, bicep flexion, triceps extension and grip are weaker than right.  Sensory: Pinprick and light touch intact throughout, bilaterally but stated to be less in left foot and hand Deep Tendon Reflexes: 2+ and symmetric throughout Plantars downgoing bilaterally Cerebellar: Normal finger-to-nose,  normal heel-to-shin test.      Lab  Results  Component Value Date/Time   CHOL 212* 06/24/2011 10:04 AM    Results for orders placed during the hospital encounter of 06/24/11 (from the past 48 hour(s))  URINALYSIS, ROUTINE W REFLEX MICROSCOPIC     Status: Normal   Collection Time   06/24/11  9:57 AM      Component Value Range Comment   Color, Urine YELLOW  YELLOW     APPearance CLEAR  CLEAR     Specific Gravity, Urine 1.006  1.005 - 1.030     pH 7.5  5.0 - 8.0     Glucose, UA NEGATIVE  NEGATIVE (mg/dL)    Hgb urine dipstick NEGATIVE  NEGATIVE     Bilirubin Urine NEGATIVE  NEGATIVE     Ketones, ur NEGATIVE  NEGATIVE (mg/dL)    Protein, ur NEGATIVE  NEGATIVE (mg/dL)    Urobilinogen,  UA 0.2  0.0 - 1.0 (mg/dL)    Nitrite NEGATIVE  NEGATIVE     Leukocytes, UA NEGATIVE  NEGATIVE  MICROSCOPIC NOT DONE ON URINES WITH NEGATIVE PROTEIN, BLOOD, LEUKOCYTES, NITRITE, OR GLUCOSE <1000 mg/dL.  CBC     Status: Abnormal   Collection Time   06/24/11 10:04 AM      Component Value Range Comment   WBC 3.6 (*) 4.0 - 10.5 (K/uL)    RBC 3.99  3.87 - 5.11 (MIL/uL)    Hemoglobin 13.2  12.0 - 15.0 (g/dL)    HCT 16.1  09.6 - 04.5 (%)    MCV 98.7  78.0 - 100.0 (fL)    MCH 33.1  26.0 - 34.0 (pg)    MCHC 33.5  30.0 - 36.0 (g/dL)    RDW 40.9  81.1 - 91.4 (%)    Platelets 147 (*) 150 - 400 (K/uL)   COMPREHENSIVE METABOLIC PANEL     Status: Abnormal   Collection Time   06/24/11 10:04 AM      Component Value Range Comment   Sodium 137  135 - 145 (mEq/L)    Potassium 4.3  3.5 - 5.1 (mEq/L)    Chloride 99  96 - 112 (mEq/L)    CO2 28  19 - 32 (mEq/L)    Glucose, Bld 88  70 - 99 (mg/dL)    BUN 12  6 - 23 (mg/dL)    Creatinine, Ser 7.82  0.50 - 1.10 (mg/dL)    Calcium 9.3  8.4 - 10.5 (mg/dL)    Total Protein 6.9  6.0 - 8.3 (g/dL)    Albumin 3.2 (*) 3.5 - 5.2 (g/dL)    AST 16  0 - 37 (U/L)    ALT 11  0 - 35 (U/L)    Alkaline Phosphatase 130 (*) 39 - 117 (U/L)    Total Bilirubin 0.6  0.3 - 1.2 (mg/dL)    GFR calc non Af Amer 86 (*) >90 (mL/min)     GFR calc Af Amer >90  >90 (mL/min)   PROTIME-INR     Status: Normal   Collection Time   06/24/11 10:04 AM      Component Value Range Comment   Prothrombin Time 14.6  11.6 - 15.2 (seconds)    INR 1.12  0.00 - 1.49    APTT     Status: Normal   Collection Time   06/24/11 10:04 AM      Component Value Range Comment   aPTT 37  24 - 37 (seconds)   LIPID PANEL     Status: Abnormal   Collection Time   06/24/11 10:04 AM      Component Value Range Comment   Cholesterol 212 (*) 0 - 200 (mg/dL)    Triglycerides 956  <150 (mg/dL)    HDL 57  >21 (mg/dL)    Total CHOL/HDL Ratio 3.7      VLDL 26  0 - 40 (mg/dL)    LDL Cholesterol 308 (*) 0 - 99 (mg/dL)     Mr Brain Wo Contrast  06/24/2011  *RADIOLOGY REPORT*  Clinical Data:  Acute left sided numbness and weakness. Hyperlipidemia.  MRI HEAD WITHOUT CONTRAST MRA HEAD WITHOUT CONTRAST  Technique: Multiplanar, multiecho pulse sequences of the brain and surrounding structures were obtained according to standard protocol without intravenous contrast.  Angiographic images of the head were obtained using MRA technique without contrast.  Comparison: None.  MRI HEAD  Findings:  Small acute non hemorrhagic infarcts anterior right  pons and mid right paracentral pontine region.  Several small remote infarcts within the cerebellum bilaterally.  Moderate small vessel disease type changes.  Global atrophy without hydrocephalus.  No intracranial hemorrhage.  No intracranial mass lesion detected on this unenhanced exam.  Partial opacification left mastoid air cells.  No obstructing lesion causing eustachian tube dysfunction detected.  Partial opacification/mucosal thickening ethmoid sinus air cells.  Mild transverse ligament hypertrophy.  Mild cervical spondylotic changes C3-4 and C4-5.  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  Several small remote infarcts within the cerebellum bilaterally.  Moderate  small vessel disease type changes.  Global atrophy without hydrocephalus.  Partial opacification left mastoid air cells.  MRA HEAD  Findings: Ectatic irregular cavernous and supraclinoid segment of the internal carotid artery bilaterally with there is mild narrowing but without high-grade stenosis.  Ectatic A1 segment of the right anterior cerebral artery.  Middle cerebral artery and A2 segment anterior cerebral artery branch vessel mild to slightly moderate irregularity and narrowing.  Right vertebral artery is dominant and ectatic with mild irregularity.  Poor delineation right PICA.  Moderate long segment narrowing of the distal left vertebral artery.  Ectatic basilar artery with mild irregularity without high-grade stenosis.  Poor delineation left AICA.  Mild ectasia posterior cerebral arteries with distal branch vessel irregularity and narrowing.  Mild bulge distal M1 segment right middle cerebral artery from which a vessel arises without discrete saccular aneurysm or vascular malformation.  IMPRESSION: Intracranial atherosclerotic type changes as detailed above.  Original Report Authenticated By: Fuller Canada, M.D.   2 D echo - Left ventricle: The cavity size was normal. There was mild focal basal hypertrophy of the septum. Systolic function was normal. The estimated ejection fraction was in the range of 55% to 60%. Wall motion was normal; there were no regional wall motion abnormalities. Doppler parameters are consistent with abnormal left ventricular relaxation (grade 1 diastolic dysfunction). - Mitral valve: Calcified annulus. Mild regurgitation. Impressions: - ? External mass compressing right atrium on subcostal views; suggest chest CT to further assess.  Carotid Doppler: Right internal carotid artery demonstrates elevated velocities in the 40-59% range. 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 Bilateral antegrade vertebral artery flow.  Assessment/Plan:    1) Small acute non-hemorrhagic infarcts anterior right pons and mid right paracentral pontine region along with several small remote infarcts within the cerebellum bilaterally. Due to distribution being in posterior circulation and bilateral, cannot rule out cardio embolic source. Artery-to-artery embolism or in situ thrombosis also possible etiologies.  Recommend:  1) continue ASA daily    2) start statin with goal LDL <70    3) TEE with bubble study  to further evaluate possible thrombus and CT to further evaluate external mass    4) continue telemetry    5) PT for gait and balance    6) Other orders as per post-stroke order set.    Felicie Morn PA-C [facility] [phone]  06/24/2011, 3:45 PM  Electronically signed: Dr. Caryl Pina

## 2011-06-24 NOTE — Progress Notes (Signed)
*  PRELIMINARY RESULTS* Vascular Ultrasound Carotid Duplex (Doppler) has been completed. Right internal carotid artery demonstrates elevated velocities in the 40-59% range. 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 Bilateral antegrade vertebral artery flow.  Malachy Moan, RDMS, RDCS 06/24/2011, 11:33 AM

## 2011-06-24 NOTE — ED Provider Notes (Signed)
Medical screening examination/treatment/procedure(s) were performed by non-physician practitioner and as supervising physician I was immediately available for consultation/collaboration.   Dione Booze, MD 06/24/11 [phone]

## 2011-06-24 NOTE — H&P (Signed)
Hospital Admission Note Date: 06/24/2011  Patient name: Kristen Lee           Medical record number: [id number] Date of birth: September 02, 1940           Age: 71 y.o.   Gender: female    PCP:   Carrie Mew, MD, MD   Chief Complaint:  Acute stroke  HPI: Kristen Lee is a 71 y.o. female with past medical history of hypothyroidism, lupus and rheumatoid arthritis. Patient came into the hospital complaining about dizziness unstable gait. Patient said about 2:30 this morning she was walking to the bathroom when she stumbled that she did have some dizziness. She did not have a lot of control over her date and she actually fell, she did not hit her head and she landed on the shower curtains and she did not hit the floor. Patient managed with her husband to go back to the bed. After she woke this morning she had the same symptoms including ataxia and dizziness. She denies any facial droop, denies any speech changes any numbness or focal neurological weaknesses. She has same symptoms about a week ago when she went to her primary care physician, but no MRI was done at that time. Upon initial evaluation in the emergency department patient evaluated by MRI and showed acute pontine stroke, there is also chronic bilateral cerebellar strokes.  Past Medical History: Past Medical History  Diagnosis Date  . Thyroid disease   . Lupus   . Arthritis     rheumatoid and osteoarthritis  . Chicken pox as a child  . Shingles   . Measles as a child  . Mumps as a child  . History of shingles 11/25/2010   Past Surgical History  Procedure Date  . Abdominal hysterectomy     partial still has ovaries  . Tonsillectomy and adenoidectomy as a child  . Removal of blockage 02-03-10    Medications: Prior to Admission medications   Medication Sig Start Date End Date Taking? Authorizing Provider  calcium carbonate (OS-CAL) 600 MG TABS Take 600 mg by mouth daily.     Yes Historical Provider, MD    estrogens, conjugated, (PREMARIN) 0.625 MG tablet Take 0.625 mg by mouth daily. 04/09/11  Yes Stacie Glaze, MD  ferrous fumarate-b12-vitamic C-folic acid (TRINSICON / FOLTRIN) capsule Take 1 capsule by mouth daily before breakfast.   Yes Historical Provider, MD  ferrous fumarate-b12-vitamic C-folic acid (TRINSICON / FOLTRIN) capsule Take 1 capsule by mouth 2 (two) times daily. 04/14/11 04/13/12 Yes Stacie Glaze, MD  Fluticasone-Salmeterol (ADVAIR) 100-50 MCG/DOSE AEPB Inhale 1 puff into the lungs every 12 (twelve) hours.   Yes Historical Provider, MD  folic acid (FOLVITE) 400 MCG tablet Take 400 mcg by mouth daily.     Yes Historical Provider, MD  leflunomide (ARAVA) 20 MG tablet Take 20 mg by mouth daily. 04/09/11  Yes Stacie Glaze, MD  levothyroxine (SYNTHROID, LEVOTHROID) 50 MCG tablet Take 50 mcg by mouth daily. 04/09/11  Yes Stacie Glaze, MD  mupirocin ointment (BACTROBAN) 2 % Apply 1 application topically 3 (three) times daily. 06/19/11 06/26/11 Yes Baker Pierini, FNP  omega-3 acid ethyl esters (LOVAZA) 1 G capsule Take 2 g by mouth 2 (two) times daily.   Yes Historical Provider, MD  tolterodine (DETROL LA) 4 MG 24 hr capsule Take 4 mg by mouth 2 (two) times daily.    Yes Historical Provider, MD  traMADol (ULTRAM) 50 MG tablet Take 50 mg by  mouth every 6 (six) hours as needed. For pain 03/11/11  Yes Stacie Glaze, MD  venlafaxine XR (EFFEXOR-XR) 75 MG 24 hr capsule Take 75 mg by mouth daily.   Yes Historical Provider, MD    Allergies:   Allergies  Allergen Reactions  . Penicillins Swelling    Social History:  reports that she quit smoking about 21 years ago. Her smoking use included Cigarettes. She has a 30 pack-year smoking history. She has never used smokeless tobacco. She reports that she does not drink alcohol or use illicit drugs.  Family History: Family History  Problem Relation Age of Onset  . Heart disease Mother   . Hyperlipidemia Mother   . Hypertension Mother   .  Emphysema Father     smoked  . Heart attack Father     massive  . Heart disease Father     MI  . Other Son     shingles  . Cancer Paternal Grandmother     Review of Systems:  Constitutional: negative for anorexia, fevers and sweats Eyes: negative for irritation, redness and visual disturbance Ears, nose, mouth, throat, and face: negative for earaches, epistaxis, nasal congestion and sore throat Respiratory: negative for cough, dyspnea on exertion, sputum and wheezing Cardiovascular: negative for chest pain, dyspnea, lower extremity edema, orthopnea, palpitations and syncope Gastrointestinal: negative for abdominal pain, constipation, diarrhea, melena, nausea and vomiting Genitourinary:negative for dysuria, frequency and hematuria Hematologic/lymphatic: negative for bleeding, easy bruising and lymphadenopathy Musculoskeletal:negative for arthralgias, muscle weakness and stiff joints Neurological: negative for coordination problems, gait problems, headaches and weakness Endocrine: negative for diabetic symptoms including polydipsia, polyuria and weight loss Allergic/Immunologic: negative for anaphylaxis, hay fever and urticaria  Physical Exam: BP 170/80  Pulse 80  Temp(Src) 97.6 F (36.4 C) (Oral)  Resp 20  SpO2 98% General appearance: alert, cooperative and no distress  Head: Normocephalic, without obvious abnormality, atraumatic  Eyes: conjunctivae/corneas clear. PERRL, EOM's intact. Fundi benign.  Nose: Nares normal. Septum midline. Mucosa normal. No drainage or sinus tenderness.  Throat: lips, mucosa, and tongue normal; teeth and gums normal  Neck: Supple, no masses, no cervical lymphadenopathy, no JVD appreciated, no meningeal signs Resp: clear to auscultation bilaterally  Chest wall: no tenderness  Cardio: regular rate and rhythm, S1, S2 normal, no murmur, click, rub or gallop  GI: soft, non-tender; bowel sounds normal; no masses, no organomegaly  Extremities:  extremities normal, atraumatic, no cyanosis or edema  Skin: Skin color, texture, turgor normal. No rashes or lesions  Neurologic: Alert and oriented X 3, normal strength and tone. Normal symmetric reflexes. Normal coordination and gait  Labs on Admission:   Basename 06/24/11 1004  NA 137  K 4.3  CL 99  CO2 28  GLUCOSE 88  BUN 12  CREATININE 0.66  CALCIUM 9.3  MG --  PHOS --    Basename 06/24/11 1004  AST 16  ALT 11  ALKPHOS 130*  BILITOT 0.6  PROT 6.9  ALBUMIN 3.2*   No results found for this basename: LIPASE:2,AMYLASE:2 in the last 72 hours  Basename 06/24/11 1004  WBC 3.6*  NEUTROABS --  HGB 13.2  HCT 39.4  MCV 98.7  PLT 147*   No results found for this basename: CKTOTAL:3,CKMB:3,CKMBINDEX:3,TROPONINI:3 in the last 72 hours No results found for this basename: TSH,T4TOTAL,FREET3,T3FREE,THYROIDAB in the last 72 hours No results found for this basename: VITAMINB12:2,FOLATE:2,FERRITIN:2,TIBC:2,IRON:2,RETICCTPCT:2 in the last 72 hours  Radiological Exams on Admission: Mr Brain Wo Contrast  06/24/2011  *RADIOLOGY REPORT*  Clinical  Data:  Acute left sided numbness and weakness. Hyperlipidemia.  MRI HEAD WITHOUT CONTRAST MRA HEAD WITHOUT CONTRAST  Technique: Multiplanar, multiecho pulse sequences of the brain and surrounding structures were obtained according to standard protocol without intravenous contrast.  Angiographic images of the head were obtained using MRA technique without contrast.  Comparison: None.  MRI HEAD  Findings:  Small acute non hemorrhagic infarcts anterior right pons and mid right paracentral pontine region.  Several small remote infarcts within the cerebellum bilaterally.  Moderate small vessel disease type changes.  Global atrophy without hydrocephalus.  No intracranial hemorrhage.  No intracranial mass lesion detected on this unenhanced exam.  Partial opacification left mastoid air cells.  No obstructing lesion causing eustachian tube dysfunction  detected.  Partial opacification/mucosal thickening ethmoid sinus air cells.  Mild transverse ligament hypertrophy.  Mild cervical spondylotic changes C3-4 and C4-5.  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  Several small remote infarcts within the cerebellum bilaterally.  Moderate small vessel disease type changes.  Global atrophy without hydrocephalus.  Partial opacification left mastoid air cells.  MRA HEAD  Findings: Ectatic irregular cavernous and supraclinoid segment of the internal carotid artery bilaterally with there is mild narrowing but without high-grade stenosis.  Ectatic A1 segment of the right anterior cerebral artery.  Middle cerebral artery and A2 segment anterior cerebral artery branch vessel mild to slightly moderate irregularity and narrowing.  Right vertebral artery is dominant and ectatic with mild irregularity.  Poor delineation right PICA.  Moderate long segment narrowing of the distal left vertebral artery.  Ectatic basilar artery with mild irregularity without high-grade stenosis.  Poor delineation left AICA.  Mild ectasia posterior cerebral arteries with distal branch vessel irregularity and narrowing.  Mild bulge distal M1 segment right middle cerebral artery from which a vessel arises without discrete saccular aneurysm or vascular malformation.  IMPRESSION: Intracranial atherosclerotic type changes as detailed above.  Original Report Authenticated By: Fuller Canada, M.D.   Mr Mra Head/brain Wo Cm  06/24/2011  *RADIOLOGY REPORT*  Clinical Data:  Acute left sided numbness and weakness. Hyperlipidemia.  MRI HEAD WITHOUT CONTRAST MRA HEAD WITHOUT CONTRAST  Technique: Multiplanar, multiecho pulse sequences of the brain and surrounding structures were obtained according to standard protocol without intravenous contrast.  Angiographic images of the head were obtained using MRA technique without contrast.  Comparison:  None.  MRI HEAD  Findings:  Small acute non hemorrhagic infarcts anterior right pons and mid right paracentral pontine region.  Several small remote infarcts within the cerebellum bilaterally.  Moderate small vessel disease type changes.  Global atrophy without hydrocephalus.  No intracranial hemorrhage.  No intracranial mass lesion detected on this unenhanced exam.  Partial opacification left mastoid air cells.  No obstructing lesion causing eustachian tube dysfunction detected.  Partial opacification/mucosal thickening ethmoid sinus air cells.  Mild transverse ligament hypertrophy.  Mild cervical spondylotic changes C3-4 and C4-5.  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  Several small remote infarcts within the cerebellum bilaterally.  Moderate small vessel disease type changes.  Global atrophy without hydrocephalus.  Partial opacification left mastoid air cells.  MRA HEAD  Findings: Ectatic irregular cavernous and supraclinoid segment of the internal carotid artery bilaterally with there is mild narrowing but without high-grade stenosis.  Ectatic A1 segment of the right anterior cerebral artery.  Middle cerebral artery and A2 segment anterior cerebral artery branch vessel mild to slightly moderate  irregularity and narrowing.  Right vertebral artery is dominant and ectatic with mild irregularity.  Poor delineation right PICA.  Moderate long segment narrowing of the distal left vertebral artery.  Ectatic basilar artery with mild irregularity without high-grade stenosis.  Poor delineation left AICA.  Mild ectasia posterior cerebral arteries with distal branch vessel irregularity and narrowing.  Mild bulge distal M1 segment right middle cerebral artery from which a vessel arises without discrete saccular aneurysm or vascular malformation.  IMPRESSION: Intracranial atherosclerotic type changes as detailed above.  Original Report Authenticated By:  Fuller Canada, M.D.     IMPRESSION: Present on Admission:  .Acute embolic stroke .HYPERLIPIDEMIA .OSTEOPOROSIS .HYPOTHYROIDISM .LUPUS ERYTHEMATOSUS, DISCOID  Assessment/Plan  Acute stroke Neurology was consulted, patient to be echocardiogram seems okay, she she might have a lesion pushing on the right atrium and will do a CT scan for that. But she passed her swallow evaluation. I will order PT/OT/SLP to evaluate her. Patient was not on aspirin or Plavix. She does have history of lupus I'm not sure if that makes her hypercoagulable. Neurology to advise. Patient started on aspirin.  Hyperlipidemia I will check fasting lipid profile in the morning.  Lupus Patient is on leflunomide for lupus, as I mentioned above I'm not sure if that makes her hypercoagulable or this is just a stroke secondary to atherosclerosis. Patient also getting estrogen supplements will discontinue that.  Hypothyroidism Check TSH, continue levothyroxine supplementation.  Janitza Revuelta A 06/24/2011, 4:27 PM

## 2011-06-24 NOTE — ED Notes (Signed)
Patient is now in vascular lab- will transport to CDU rm 2 after procedure.  Family at bedside

## 2011-06-24 NOTE — Progress Notes (Signed)
Met with patient and her family to discuss home health options. Patient could not identify any barriers to injury-free living at home; but she said that she would consider home health options. I provided the family with a list of agencies and sitters.

## 2011-06-24 NOTE — ED Notes (Signed)
Pt here for fall this am and feels lightheaded and dizzy, denies pain and denies inj, onset Wednesday. htn noted, with no hx  Complains of numbness and tingling in left arm and leg, but negative stroke scale.

## 2011-06-24 NOTE — ED Notes (Signed)
Patient transported to Ultrasound 

## 2011-06-24 NOTE — ED Provider Notes (Signed)
Medical screening examination/treatment/procedure(s) were performed by non-physician practitioner and as supervising physician I was immediately available for consultation/collaboration.   Trevon Strothers, MD 06/24/11 1613 

## 2011-06-24 NOTE — ED Notes (Signed)
Hospitalist at bedside speaking with family

## 2011-06-25 DIAGNOSIS — I6340 Cerebral infarction due to embolism of unspecified cerebral artery: Secondary | ICD-10-CM

## 2011-06-25 DIAGNOSIS — G459 Transient cerebral ischemic attack, unspecified: Secondary | ICD-10-CM

## 2011-06-25 DIAGNOSIS — R222 Localized swelling, mass and lump, trunk: Secondary | ICD-10-CM

## 2011-06-25 DIAGNOSIS — G119 Hereditary ataxia, unspecified: Secondary | ICD-10-CM

## 2011-06-25 DIAGNOSIS — L930 Discoid lupus erythematosus: Secondary | ICD-10-CM

## 2011-06-25 LAB — LIPID PANEL
Cholesterol: 194 mg/dL (ref 0–200)
Triglycerides: 123 mg/dL (ref <150)
VLDL: 25 mg/dL (ref 0–40)

## 2011-06-25 LAB — HEMOGLOBIN A1C: Hgb A1c MFr Bld: 5.3 % (ref <5.7)

## 2011-06-25 MED ORDER — ASPIRIN 325 MG PO TABS: 325.0000 mg | ORAL_TABLET | Freq: Every day | ORAL | Status: AC

## 2011-06-25 MED ORDER — LIDOCAINE-EPINEPHRINE-TETRACAINE (LET) SOLUTION
NASAL | Status: AC
  Filled 2011-06-25: qty 3

## 2011-06-25 MED ORDER — CALCIUM CARBONATE 1250 (500 CA) MG PO TABS
1.0000 | ORAL_TABLET | Freq: Every day | ORAL | Status: DC
  Filled 2011-06-25: qty 1

## 2011-06-25 MED ORDER — LEVOTHYROXINE SODIUM 75 MCG PO TABS: 75.0000 ug | ORAL_TABLET | Freq: Every day | ORAL | Status: DC

## 2011-06-25 MED ORDER — SIMVASTATIN 20 MG PO TABS: 20.0000 mg | ORAL_TABLET | Freq: Every evening | ORAL | Status: DC

## 2011-06-25 MED ORDER — LEVOTHYROXINE SODIUM 50 MCG PO TABS: 25.0000 ug | ORAL_TABLET | Freq: Every day | ORAL | Status: DC

## 2011-06-25 NOTE — Care Management Note (Signed)
    Page 1 of 1   06/25/2011     2:43:05 PM   CARE MANAGEMENT NOTE 06/25/2011  Patient:  Kristen Lee, Kristen Lee   Account Number:  [id number]  Date Initiated:  06/25/2011  Documentation initiated by:  [facility]  Subjective/Objective Assessment:   Admitted with CVA. Lives with spouse.     Action/Plan:   PT eval-recommends HHPT  OT eval- no d/c needs  ST eval-no d/c needs   Anticipated DC Date:  06/28/2011   Anticipated DC Plan:  HOME W HOME HEALTH SERVICES      DC Planning Services  CM consult      Choice offered to / List presented to:  C-1 Patient        HH arranged  HH-2 PT      [facility] agency  [facility]   Status of service:  Completed, signed off Medicare Important Message given?   (If response is "NO", the following Medicare IM given date fields will be blank) Date Medicare IM given:   Date Additional Medicare IM given:    Discharge Disposition:  HOME W HOME HEALTH SERVICES  Per UR Regulation:  Reviewed for med. necessity/level of care/duration of stay  If discussed at Long Length of Stay Meetings, dates discussed:    Comments:  PCP Dr. Darryll Capers  06/25/11 Spoke with patient about HHC for HHPT. She chose [facility] from the [facility] agencies list. Omer Jack at [facility] and requested HHPT. Patient states that she has a rolling walker at home. Jacquelynn Cree RN, BSN, CCM

## 2011-06-25 NOTE — Progress Notes (Signed)
Pt observed with PT in hallway, no acute speech language or cognitive needs identified, RN documented the same. Will defer full eval. Please reorder SLP if needs arise. Harlon Ditty, MA CCC-SLP [phone]

## 2011-06-25 NOTE — Progress Notes (Signed)
Brief Nutrition Note: Pt identified on nutrition risk report for wt loss. Per pt she lost 12 lbs back in 02/2009 due to intestinal obstruction and surgery. Pt's wt has been stable since with good appetite.  Pt admitted with new stroke.  24  Hour recall reviewed with pt. Encouraged continued good intake. Lipid Panel     Component Value Date/Time   CHOL 194 06/25/2011 0628   TRIG 123 06/25/2011 0628   HDL 51 06/25/2011 0628   CHOLHDL 3.8 06/25/2011 0628   VLDL 25 06/25/2011 0628   LDLCALC 118* 06/25/2011 0628  Please re-consult as needed. Kendell Bane Cornelison [phone]

## 2011-06-25 NOTE — Evaluation (Signed)
Physical Therapy Evaluation Patient Details Name: Kristen Lee MRN: [id number] DOB: 1940/12/05 Today's Date: 06/25/2011 Time: 1010-1037 PT Time Calculation (min): 27 min  PT Assessment / Plan / Recommendation Clinical Impression  Pt is 71 y/o female admitted for small acute non hemorrhagic infarcts anterior right pons and mid and bilateral cerebellar infarcts.  Pt will benefit from acute PT services to improve overall mobility and balance.    PT Assessment  Patient needs continued PT services    Follow Up Recommendations  Home health PT;Supervision/Assistance - 24 hour    Barriers to Discharge        lEquipment Recommendations   (May need RW)    Recommendations for Other Services     Frequency Min 4X/week    Precautions / Restrictions Precautions Precautions: Fall   Pertinent Vitals/Pain No c/o pain      Mobility  Bed Mobility Bed Mobility: Supine to Sit;Sitting - Scoot to Edge of Bed Supine to Sit: 7: Independent Sitting - Scoot to Edge of Bed: 7: Independent Transfers Transfers: Sit to Stand;Stand to Sit Sit to Stand: 4: Min guard;From bed;With upper extremity assist Stand to Sit: 5: Supervision;To chair/3-in-1;To bed;With armrests;With upper extremity assist Details for Transfer Assistance: Min guard for safety.  VC's for safe hand placement as pt tends to put hands on RW during transfer. Ambulation/Gait Ambulation/Gait Assistance: 4: Min assist Ambulation Distance (Feet): 100 Feet Assistive device: Rolling walker Ambulation/Gait Assistance Details: (A) due to LOB with turns using the RW; Max cues for RW placement and body position within RW Gait Pattern: Step-through pattern;Shuffle;Trunk flexed Modified Rankin (Stroke Patients Only) Pre-Morbid Rankin Score: No symptoms Modified Rankin: Moderately severe disability    Exercises     PT Diagnosis: Difficulty walking;Abnormality of gait;Generalized weakness  PT Problem List: Decreased strength;Decreased  balance;Decreased mobility;Decreased knowledge of use of DME PT Treatment Interventions: DME instruction;Gait training;Stair training;Functional mobility training;Therapeutic activities;Therapeutic exercise;Balance training;Neuromuscular re-education;Patient/family education   PT Goals Acute Rehab PT Goals PT Goal Formulation: With patient Time For Goal Achievement: 07/02/11 Potential to Achieve Goals: Good Pt will go Sit to Stand: with modified independence PT Goal: Sit to Stand - Progress: Goal set today Pt will go Stand to Sit: with modified independence PT Goal: Stand to Sit - Progress: Goal set today Pt will Stand: with modified independence;1 - 2 min PT Goal: Stand - Progress: Goal set today Pt will Ambulate: >150 feet;with modified independence;with rolling walker PT Goal: Ambulate - Progress: Goal set today Pt will Go Up / Down Stairs: with modified independence;with least restrictive assistive device;3-5 stairs PT Goal: Up/Down Stairs - Progress: Goal set today Additional Goals Additional Goal #1: Pt will score >19/24 on DGI to reduce risk of falling. PT Goal: Additional Goal #1 - Progress: Goal set today  Visit Information  Last PT Received On: 06/25/11 Assistance Needed: +1 PT/OT Co-Evaluation/Treatment: Yes    Subjective Data  Subjective: "I was completely independent before I came to the hospital." Patient Stated Goal: To be able to go to the beach.   Prior Functioning  Home Living Lives With: Spouse Available Help at Discharge: Family Type of Home: House Home Access: Stairs to enter [employer] of Steps: 4 Entrance Stairs-Rails: Left Home Layout: Two level;Able to live on main level with bedroom/bathroom Bathroom Shower/Tub: Tub/shower unit;Curtain Bathroom Toilet: Standard Bathroom Accessibility: Yes How Accessible: Accessible via walker Home Adaptive Equipment: Bedside commode/3-in-1;Shower chair with back;Walker - rolling Prior Function Level  of Independence: Independent Able to Take Stairs?: Yes Driving: Yes Vocation:  Retired Comments: Pt likes to word puzzles Communication Communication: No difficulties Dominant Hand: Right    Cognition  Overall Cognitive Status: Appears within functional limits for tasks assessed/performed Arousal/Alertness: Awake/alert Orientation Level: Appears intact for tasks assessed    Extremity/Trunk Assessment Right Lower Extremity Assessment RLE ROM/Strength/Tone: Within functional levels RLE Sensation: WFL - Light Touch RLE Coordination: WFL - gross/fine motor Left Lower Extremity Assessment LLE ROM/Strength/Tone: Deficits LLE ROM/Strength/Tone Deficits: 4+/5 quad; 4/5 hip flexor; 4/5 knee flexion LLE Sensation: WFL - Light Touch LLE Coordination: WFL - gross/fine motor Trunk Assessment Trunk Assessment: Kyphotic   Balance    End of Session PT - End of Session Equipment Utilized During Treatment: Gait belt Activity Tolerance: Patient tolerated treatment well Patient left: in chair;with call bell/phone within reach Nurse Communication: Mobility status   Dewey Neukam 06/25/2011, 1:31 PM Jake Shark, PT DPT [phone]

## 2011-06-25 NOTE — Progress Notes (Signed)
Occupational Therapy Evaluation Patient Details Name: Kristen Lee MRN: [id number] DOB: 08-16-1940 Today's Date: 06/25/2011 Time: 1010-1037 OT Time Calculation (min): 27 min  OT Assessment / Plan / Recommendation Clinical Impression  Pt admitted for small acute non hemorrhagic infarcts anterior right pons and mid and bilateral cerebellar infarcts.  Pt will benefit from acute OT to address below problem list in prep for safe d/c home with spouse.    OT Assessment  Patient needs continued OT Services    Follow Up Recommendations  Supervision/Assistance - 24 hour;No OT follow up    Barriers to Discharge      Equipment Recommendations  None recommended by OT    Recommendations for Other Services    Frequency  Min 2X/week    Precautions / Restrictions Precautions Precautions: Fall   Pertinent Vitals/Pain N/A    ADL  Grooming: Performed;Wash/dry hands;Min guard Where Assessed - Grooming: Unsupported standing Upper Body Dressing: Performed;Set up Where Assessed - Upper Body Dressing: Unsupported sitting Lower Body Dressing: Performed;Set up Where Assessed - Lower Body Dressing: Unsupported sitting Toilet Transfer: Simulated;Minimal assistance Toilet Transfer Method:  (ambulating) Toilet Transfer Equipment:  (recliner) Equipment Used: Gait belt;Rolling walker Transfers/Ambulation Related to ADLs: Pt ambulated with min (A) with RW for safety with RW and for steadying during turns.    OT Diagnosis: Generalized weakness  OT Problem List: Impaired balance (sitting and/or standing);Decreased knowledge of use of DME or AE OT Treatment Interventions: Self-care/ADL training;DME and/or AE instruction;Therapeutic activities;Patient/family education;Balance training   OT Goals Acute Rehab OT Goals OT Goal Formulation: With patient Time For Goal Achievement: 07/02/11 Potential to Achieve Goals: Good ADL Goals Pt Will Transfer to Toilet: with modified  independence;Ambulation;with DME;Regular height toilet ADL Goal: Toilet Transfer - Progress: Goal set today Pt Will Perform Tub/Shower Transfer: Tub transfer;with supervision;Ambulation;with DME;Shower seat with back ADL Goal: Web designer - Progress: Goal set today Miscellaneous OT Goals Miscellaneous OT Goal #1: Pt will independently demonstrate safe use of RW and correct hand placement during functional transfers. OT Goal: Miscellaneous Goal #1 - Progress: Goal set today  Visit Information  Last OT Received On: 06/25/11 Assistance Needed: +1 PT/OT Co-Evaluation/Treatment: Yes    Subjective Data      Prior Functioning  Home Living Lives With: Spouse Available Help at Discharge: Family Type of Home: House Home Access: Stairs to enter [employer] of Steps: 4 Entrance Stairs-Rails: Left Home Layout: Two level;Able to live on main level with bedroom/bathroom Bathroom Shower/Tub: Tub/shower unit;Curtain Bathroom Toilet: Standard Bathroom Accessibility: Yes How Accessible: Accessible via walker Home Adaptive Equipment: Bedside commode/3-in-1;Shower chair with back;Walker - rolling Prior Function Level of Independence: Independent Able to Take Stairs?: Yes Driving: Yes Vocation: Retired Musician: No difficulties Dominant Hand: Right    Cognition  Overall Cognitive Status: Appears within functional limits for tasks assessed/performed Arousal/Alertness: Awake/alert Orientation Level: Appears intact for tasks assessed    Extremity/Trunk Assessment Right Upper Extremity Assessment RUE ROM/Strength/Tone: Within functional levels RUE Sensation: WFL - Light Touch;WFL - Proprioception RUE Coordination: Deficits RUE Coordination Deficits: Decreased fine motor coordination due to RA Left Upper Extremity Assessment LUE ROM/Strength/Tone: Within functional levels LUE Sensation: Deficits LUE Sensation Deficits: Reports tingling in left hand, but  proprioception WFL. LUE Coordination: Deficits LUE Coordination Deficits: Decreased fine motor coordination due to RA Right Lower Extremity Assessment RLE ROM/Strength/Tone: Within functional levels RLE Sensation: WFL - Light Touch RLE Coordination: WFL - gross/fine motor Left Lower Extremity Assessment LLE ROM/Strength/Tone: Deficits LLE ROM/Strength/Tone Deficits: 4+/5 quad; 4/5 hip flexor;  4/5 knee flexion LLE Sensation: WFL - Light Touch LLE Coordination: WFL - gross/fine motor Trunk Assessment Trunk Assessment: Kyphotic   Mobility Bed Mobility Bed Mobility: Supine to Sit;Sitting - Scoot to Edge of Bed Supine to Sit: 7: Independent Sitting - Scoot to Edge of Bed: 7: Independent Transfers Transfers: Sit to Stand;Stand to Sit Sit to Stand: 4: Min guard;From bed;With upper extremity assist Stand to Sit: 5: Supervision;To chair/3-in-1;To bed;With armrests;With upper extremity assist Details for Transfer Assistance: Min guard for safety.  VC's for safe hand placement as pt tends to put hands on RW during transfer.   Exercise    Balance    End of Session OT - End of Session Equipment Utilized During Treatment: Gait belt Activity Tolerance: Patient tolerated treatment well Patient left: in chair;with call bell/phone within reach;with family/visitor present Nurse Communication: Mobility status  06/25/2011 Kristen Lee OTR/L Pager [phone] Office [phone]  Kristen Lee, Kristen Lee 06/25/2011, 2:04 PM

## 2011-06-25 NOTE — Plan of Care (Signed)
Problem: Phase II Progression Outcomes Goal: Able to communicate Outcome: Completed/Met Date Met:  06/25/11 Patient shows no signs of dysarthria or aphasia. Able to communicate needs and desires.

## 2011-06-25 NOTE — Discharge Summary (Addendum)
Patient ID: Kristen Lee MRN: [id number] DOB/AGE: 73-22-1942 73 y.o.  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      calcium carbonate 600 MG Tabs   Commonly known as: OS-CAL   Take 600 mg by mouth daily.      ferrous fumarate-b12-vitamic C-folic acid capsule   Commonly known as: TRINSICON / FOLTRIN   Take 1 capsule by mouth daily before breakfast.      ferrous fumarate-b12-vitamic C-folic acid capsule   Commonly known as: TRINSICON / FOLTRIN   Take 1 capsule by mouth 2 (two) times daily.      Fluticasone-Salmeterol 100-50 MCG/DOSE Aepb   Commonly known as: ADVAIR   Inhale 1 puff into the lungs every 12 (twelve) hours.      folic acid 400 MCG tablet   Commonly known as: FOLVITE   Take 400 mcg by mouth daily.      leflunomide 20 MG tablet   Commonly known as: ARAVA   Take 20 mg by mouth daily.      levothyroxine 75 MCG tablet   Commonly known as: SYNTHROID, LEVOTHROID   Take 75 mcg total by mouth daily.      mupirocin ointment 2 %   Commonly known as: BACTROBAN   Apply 1 application topically 3 (three) times daily.      omega-3 acid ethyl esters 1 G capsule   Commonly known as: LOVAZA   Take 2 g by mouth 2 (two) times daily.      simvastatin 20 MG tablet   Commonly known as: ZOCOR   Take 1 tablet (20 mg total) by mouth every evening.      tolterodine 4 MG 24 hr capsule   Commonly known as: DETROL LA   Take  4 mg by mouth 2 (two) times daily.      traMADol 50 MG tablet   Commonly known as: ULTRAM   Take 50 mg by mouth every 6 (six) hours as needed. For pain      venlafaxine XR 75 MG 24 hr capsule   Commonly known as: EFFEXOR-XR   Take 75 mg by mouth daily.            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  No pericardial mass.  **END ADDENDUM** SIGNED BY: Reyes Ivan, M.D.   06/25/2011  *RADIOLOGY REPORT*  Clinical Data: Evaluate chest anatomy.  Lesion pushing on the right atrium.  Fall.  CT CHEST WITHOUT CONTRAST  Technique:  Multidetector CT imaging of the chest was performed following the standard protocol without IV contrast.  Comparison: 11/15/2008  and 12/09/2006.  Findings: Mediastinal lymph nodes are not enlarged by CT size criteria.  Hilar regions are difficult to definitively evaluate without IV contrast.  No axillary adenopathy.  Atherosclerotic calcification of the arterial vasculature, including coronary arteries.  Heart size normal.  No pericardial effusion.  The right hemidiaphragm is elevated, with the liver exerting slight mass effect on the right heart border.  Biapical pleural parenchymal scarring, as before.  There is fairly diffuse but mild peribronchovascular nodularity and scattered bronchiectasis, progressive from 11/15/2008.  A cystic and solid spiculated nodule in the left upper lobe measures approximately 0.7 x 1.9 cm (images 17 18).  The lesion appears to have progressed from 11/15/2008.  There is chronic atelectasis and bronchiectasis in the right middle lobe and right lower lobe, adjacent to an elevated right hemidiaphragm.  No pleural fluid.  Airway is unremarkable.   Incidental imaging of the upper abdomen shows no acute findings. No worrisome lytic or sclerotic lesions.  IMPRESSION:  1.  Elevated right hemidiaphragm.  Liver exerts slight mass effect on the right heart border.  No mass. 2.  Spiculated left upper lobe nodule, containing cystic and solid components, which appears progressive from 11/15/2008.  Finding is worrisome for primary bronchogenic carcinoma. Consultation with the [facility] [phone]) should be considered. These results will be called to the ordering clinician or representative by the Radiologist Assistant, and communication documented in the PACS Dashboard. 3.  Mildly progressive pattern of peribronchovascular nodularity and bronchiectasis, indicative of mycobacterium avium complex (MAC).  Original Report Authenticated By: Reyes Ivan, M.D.   Mr Brain Wo Contrast  06/24/2011  *RADIOLOGY REPORT*  Clinical Data:  Acute left sided numbness and weakness. Hyperlipidemia.  MRI HEAD WITHOUT CONTRAST MRA HEAD WITHOUT CONTRAST  Technique: Multiplanar, multiecho pulse sequences of the brain and surrounding structures were obtained according to standard protocol without intravenous contrast.  Angiographic images of the head were obtained using MRA technique without contrast.  Comparison: None.  MRI HEAD  Findings:  Small acute non hemorrhagic infarcts anterior right pons and mid right paracentral pontine region.  Several small remote infarcts within the cerebellum bilaterally.  Moderate small vessel disease type changes.  Global atrophy without hydrocephalus.  No intracranial hemorrhage.  No intracranial mass lesion detected on this unenhanced exam.  Partial opacification left mastoid air cells.  No obstructing lesion causing eustachian tube dysfunction detected.  Partial opacification/mucosal thickening ethmoid sinus air cells.  Mild transverse ligament hypertrophy.  Mild cervical spondylotic changes C3-4 and C4-5.   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  Several small remote infarcts within the cerebellum bilaterally.  Moderate small vessel disease type changes.  Global atrophy without hydrocephalus.  Partial opacification left mastoid air cells.  MRA HEAD  Findings: Ectatic irregular cavernous and supraclinoid segment of the internal carotid artery bilaterally with there is mild narrowing but without high-grade stenosis.  Ectatic A1 segment of the right anterior cerebral artery.  Middle cerebral artery and A2 segment anterior cerebral artery branch vessel mild to slightly moderate irregularity and narrowing.  Right vertebral artery is dominant and ectatic with mild irregularity.  Poor delineation right PICA.  Moderate long segment narrowing of the distal left vertebral artery.  Ectatic basilar artery with mild irregularity without high-grade stenosis.  Poor delineation left AICA.  Mild ectasia posterior cerebral arteries with distal branch vessel irregularity and narrowing.  Mild bulge distal M1 segment right middle cerebral artery from which a vessel  arises without discrete saccular aneurysm or vascular malformation.  IMPRESSION: Intracranial atherosclerotic type changes as detailed above.  Original Report Authenticated By: Fuller Canada, M.D.   Mr Mra Head/brain Wo Cm  06/24/2011  *RADIOLOGY REPORT*  Clinical Data:  Acute left sided numbness and weakness. Hyperlipidemia.  MRI HEAD WITHOUT CONTRAST MRA HEAD WITHOUT CONTRAST  Technique: Multiplanar, multiecho pulse sequences of the brain and surrounding structures were obtained according to standard protocol without intravenous contrast.  Angiographic images of the head were obtained using MRA technique without contrast.  Comparison: None.  MRI HEAD  Findings:  Small acute non hemorrhagic infarcts anterior right pons and mid right paracentral pontine region.  Several small remote infarcts within the  cerebellum bilaterally.  Moderate small vessel disease type changes.  Global atrophy without hydrocephalus.  No intracranial hemorrhage.  No intracranial mass lesion detected on this unenhanced exam.  Partial opacification left mastoid air cells.  No obstructing lesion causing eustachian tube dysfunction detected.  Partial opacification/mucosal thickening ethmoid sinus air cells.  Mild transverse ligament hypertrophy.  Mild cervical spondylotic changes C3-4 and C4-5.  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  Several small remote infarcts within the cerebellum bilaterally.  Moderate small vessel disease type changes.  Global atrophy without hydrocephalus.  Partial opacification left mastoid air cells.  MRA HEAD  Findings: Ectatic irregular cavernous and supraclinoid segment of the internal carotid artery bilaterally with there is mild narrowing but without high-grade stenosis.  Ectatic A1 segment of the right anterior cerebral artery.  Middle cerebral artery and A2 segment anterior cerebral artery branch vessel mild to slightly moderate irregularity and narrowing.  Right vertebral artery is dominant and ectatic with mild irregularity.  Poor delineation right PICA.  Moderate long segment narrowing of the distal left vertebral artery.  Ectatic basilar artery with mild irregularity without high-grade stenosis.  Poor delineation left AICA.  Mild ectasia posterior cerebral arteries with distal branch vessel irregularity and narrowing.  Mild bulge distal M1 segment right middle cerebral artery from which a vessel arises without discrete saccular aneurysm or vascular malformation.  IMPRESSION: Intracranial atherosclerotic type changes as detailed above.  Original Report Authenticated By: Fuller Canada, M.D.    Brief H and P: For complete details please refer to admission H and P, but in brief Kristen Lee is a 71 y.o. female with past  medical history of hypothyroidism, lupus and rheumatoid arthritis. Patient came into the hospital complaining about dizziness unstable gait. Patient said about 2:30 this morning she was walking to the bathroom when she stumbled that she did have some dizziness. She did not have a lot of control over her date and she actually fell, she did not hit her head and she landed on the shower curtains and she did not hit the floor. Patient managed with her husband to go back to the bed. After she woke this morning she had the same symptoms including ataxia and dizziness. She denies any facial droop, denies any speech changes any numbness or focal neurological weaknesses. She has same symptoms about a week ago when she went to her primary care physician, but no MRI was done at that time. Upon initial evaluation in the emergency department patient evaluated by MRI and showed acute pontine stroke, there is also chronic bilateral cerebellar strokes.      Physical Exam on Discharge:  Filed Vitals:   06/25/11 0600 06/25/11 0800 06/25/11 0827 06/25/11 1000  BP: 160/98 147/87  138/72  Pulse: 81 62  85  Temp: 98.7 F (37.1 C) 98.2 F (36.8 C)  97 F (36.1 C)  TempSrc: Oral Oral  Oral  Resp: 18 18  18   Height:      Weight:      SpO2: 96% 93% 96% 95%    No intake or output data in the 24 hours ending 06/25/11 1345  General: Alert, awake, oriented x3, in no acute distress. HEENT: No bruits, no goiter. Heart: Regular rate and rhythm, without murmurs, rubs, gallops. Lungs: Clear to auscultation bilaterally. Abdomen: Soft, nontender, nondistended, positive bowel sounds. Extremities: No clubbing cyanosis or edema with positive pedal pulses. Neuro: AAOX3, Grossly intact, some numbness over left extremities.  CBC:    Component Value Date/Time   WBC 3.6* 06/24/2011 1004   HGB 13.2 06/24/2011 1004   HCT 39.4 06/24/2011 1004   PLT 147* 06/24/2011 1004   MCV 98.7 06/24/2011 1004   NEUTROABS 2.3 06/19/2011 1134    LYMPHSABS 1.0 06/19/2011 1134   MONOABS 0.4 06/19/2011 1134   EOSABS 0.1 06/19/2011 1134   BASOSABS 0.0 06/19/2011 1134    Basic Metabolic Panel:    Component Value Date/Time   NA 137 06/24/2011 1004   K 4.3 06/24/2011 1004   CL 99 06/24/2011 1004   CO2 28 06/24/2011 1004   BUN 12 06/24/2011 1004   CREATININE 0.66 06/24/2011 1004   GLUCOSE 88 06/24/2011 1004   CALCIUM 9.3 06/24/2011 1004    Hospital Course:  Acute rt pontine non hemorrhagic stroke  -Above findings noted on MRI of the brain -Admitted to telemetry -Patient was not on any aspirin or Plavix at home and was started on a full dose aspirin. CVA workup including 2-D echo, carotid Doppler done.  -2D echocardiogram showed a normal EF however could not exclude an extrinsic mass compressing on the right atrium and recommended for a CT chest . -. CT chest done showed Elevated right hemidiaphragm. Liver exerts slight mass effect  on the right heart border. No mass. 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 No  pericardial mass.  I discussed the CT chest and 2-D echo finding with the radiologist Dr. Fredirick Lathe and the Dr. Jens Som who had reviewed the 2-D echo. He is the external mass effect on the right atrium is unrelated hemidiaphragm exerted by the liver and would not warrant a TEE to further evaluate.  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 Straight of lupus and rheumatoid arthritis does put her risk for microvascular ischemia. Patient also getting estrogen supplements has been discontinued  Hypothyroidism  Noted for mildly elevated TSH, ( 5.2) . Will increase her Synthroid dose to 75 mcg upon discharge   Left upper lung mass CT  chest comments on  "Spiculated left upper lobe nodule, containing cystic and solid  components, which appears progressive from  11/15/2008. Finding is  worrisome for primary bronchogenic carcinoma." Patient denies any cough, hemoptysis or weight loss. Has smoking history and quit over 3 years back. She Should be followed up as outpatient and I have made referral to [facility] for further evaluation.  CT chest also commends on bronchiectatic changes and ? MAC . i discussed with ID Dr Ninetta Lights over the phone and recommends no inpatient workup unless patient clinically symptomatic and can be followed  with outpatient sputum for AFB.   Patient seen by PT and recommended home with PT and 24 hrs supervision. Rolling walker ordered.  She is clinically stable to be discharged home with outpatient follow up.  Time spent on Discharge: 45 minutes  Signed: Eddie North 06/25/2011, 1:45 PM

## 2011-06-25 NOTE — Progress Notes (Signed)
Pts discharge instructions reviewed with patient and family.  No questions or concerns voiced.  Home Health Pt. Has been arranged, and pt. Has a rolling walker at home.

## 2011-06-25 NOTE — Progress Notes (Signed)
Stroke Team Progress Note  HISTORY Kristen Lee is an 70 y.o. female who noted last Wednesday 06/18/2011  that she felt "woozy" in her head and off balance for most of the day. This was most notable when she was standing. Her symptoms stopped by the next day. Due to these symptoms she made a appointment with her primary care MD who obtained a 12 lead ECG which was normal. Patient woke up 06/24/2011 in the morning at 2 am feeling significantly off balance and tended to veer to the left per husband. When she awoke at 6 her symptoms persisted and she was brought to the ED. Patient has never been on ASA and does not recall having high cholesterol.  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 Several small remote infarcts within the cerebellum bilaterally. Moderate small vessel disease type changes". MRA head showed "Right vertebral artery is dominant and ectatic with mild irregularity. Poor delineation right PICA. Moderate long segment narrowing of the distal left vertebral artery. Ectatic basilar artery with mild irregularity without high-grade stenosis. " At present time she continues to have decreased sensation in left foot and hand. Patient was not a TPA candidate secondary to delay in arrival. She was admitted for further evaluation and treatment.  SUBJECTIVE Her husband and son is at the bedside.  Overall she feels her condition is gradually improving. She has been up to the bathroom with either nursing help or a walker; she has not been evaluated by therapy yet.  OBJECTIVE Most recent Vital Signs: Filed Vitals:   06/24/11 2200 06/25/11 0200 06/25/11 0400 06/25/11 0600  BP: 186/82 139/80 138/84 160/98  Pulse: 87 88 89 81  Temp: 97.7 F (36.5 C) 98 F (36.7 C) 98.4 F (36.9 C) 98.7 F (37.1 C)  TempSrc: Oral Oral Oral Oral  Resp: 18 18 19 18   Height: 5\' 4"  (1.626 m)     Weight: 55.792 kg (123 lb)     SpO2: 93% 96%  94% 96%   CBG (last 3)  No results found for this basename: GLUCAP:3 in the last 72 hours Intake/Output from previous day:   IV Fluid Intake:     MEDICATIONS    . aspirin  325 mg Oral Daily   Or  . aspirin  300 mg Rectal Daily  . calcium carbonate  600 mg Oral Q breakfast  . enoxaparin  40 mg Subcutaneous Daily  . fesoterodine  8 mg Oral Daily  . Fluticasone-Salmeterol  1 puff Inhalation Q12H  . folic acid  0.5 mg Oral Daily  . leflunomide  20 mg Oral Daily  . levothyroxine  50 mcg Oral QAC breakfast  . meclizine  25 mg Oral Once  . mupirocin ointment  1 application Topical TID  . omega-3 acid ethyl esters  2 g Oral BID  . venlafaxine XR  75 mg Oral Q breakfast  . DISCONTD: aspirin  300 mg Rectal Daily  . DISCONTD: aspirin  325 mg Oral Daily  . DISCONTD: folic acid  400 mcg Oral Daily   PRN:  ondansetron (ZOFRAN) IV, senna-docusate  Diet:  Cardiac thin liquids Activity:  OOB DVT Prophylaxis:  Lovenox 40 mg sq daily   CLINICALLY SIGNIFICANT STUDIES Basic Metabolic Panel:  Lab 06/24/11 1610 06/19/11 1134  NA 137 138  K 4.3 4.1  CL 99 103  CO2 28 26  GLUCOSE 88 80  BUN 12 13  CREATININE 0.66 0.7  CALCIUM  9.3 9.0  MG -- --  PHOS -- --   Liver Function Tests:  Lab 06/24/11 1004  AST 16  ALT 11  ALKPHOS 130*  BILITOT 0.6  PROT 6.9  ALBUMIN 3.2*   CBC:  Lab 06/24/11 1004 06/19/11 1134  WBC 3.6* 3.8*  NEUTROABS -- 2.3  HGB 13.2 12.8  HCT 39.4 38.4  MCV 98.7 101.0*  PLT 147* 132.0*   Coagulation:  Lab 06/24/11 1004  LABPROT 14.6  INR 1.12   Urinalysis:  Lab 06/24/11 0957  COLORURINE YELLOW  LABSPEC 1.006  PHURINE 7.5  GLUCOSEU NEGATIVE  HGBUR NEGATIVE  BILIRUBINUR NEGATIVE  KETONESUR NEGATIVE  PROTEINUR NEGATIVE  UROBILINOGEN 0.2  NITRITE NEGATIVE  LEUKOCYTESUR NEGATIVE   Lipid Panel    Component Value Date/Time   CHOL 194 06/25/2011 0628   TRIG 123 06/25/2011 0628   HDL 51 06/25/2011 0628   CHOLHDL 3.8 06/25/2011 0628   VLDL 25 06/25/2011  0628   LDLCALC 118* 06/25/2011 0628   HgbA1C  Lab Results  Component Value Date   HGBA1C 5.2 06/24/2011   Urine Drug Screen:   No results found for this basename: labopia, cocainscrnur, labbenz, amphetmu, thcu, labbarb    Alcohol Level: No results found for this basename: ETH:2 in the last 168 hours  Thyroid Function Tests:  Lab 06/24/11 1750  TSH 5.076*  T4TOTAL --  FREET4 --  T3FREE --  THYROIDAB --   CT of the brain  06/25/2011  1.  Elevated right hemidiaphragm.  Liver exerts slight mass effect on the right heart border.  No mass. 2.  Spiculated left upper lobe nodule, containing cystic and solid components, which appears progressive from 11/15/2008.  Finding is worrisome for primary bronchogenic carcinoma. Consultation with the [facility] [phone]) should be considered. These results will be called to the ordering clinician or representative by the Radiologist Assistant, and communication documented in the PACS Dashboard. 3.  Mildly progressive pattern of peribronchovascular nodularity and bronchiectasis, indicative of mycobacterium avium complex (MAC).   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  Several small remote infarcts within the cerebellum bilaterally.  Moderate small vessel disease type changes.  Global atrophy without hydrocephalus.  Partial opacification left mastoid air cells.    MRA of the brain  06/24/2011  Intracranial atherosclerotic type changes as detailed above.  2D Echocardiogram  EF 55-60% with no source of embolus. ? External mass compressing right atrium on subcostal views; suggest chest CT to further assess.  Carotid Doppler  Right internal carotid artery demonstrates elevated velocities in the 40-59% range. Right common carotid artery demonstrates abnormal waveforms compared to the left common carotid artery, which is suggestive of a  proximal narrowing of the innominate artery. Bilateral antegrade vertebral artery flow.  CXR    CT Chest  1. Elevated right hemidiaphragm. Liver exerts slight mass effect on the right heart border. No mass. 2. Spiculated left upper lobe nodule, containing cystic and solid components, which appears progressive from 11/15/2008. Finding is worrisome for primary bronchogenic carcinoma. Consultation with the [facility] [phone]) should be considered. These results will be called to the ordering clinician or representative by the Radiologist Assistant, and communication documented in the PACS Dashboard. 3. Mildly progressive pattern of peribronchovascular nodularity and bronchiectasis, indicative of mycobacterium avium complex (MAC).   EKG  normal sinus rhythm, PAC's noted, left axis deviation.   Therapy Recommendations PT -,  OT -  Physical Exam  Frail elderly caucasian lady not in distress.Awake alert. Afebrile. Head is nontraumatic. Neck is supple without bruit. Hearing is normal. Cardiac exam no murmur or gallop. Lungs are clear to auscultation. Distal pulses are well felt. Severe deformity of fingers from advanced rheumatoid arthritis Neurological exam : Awake  Alert oriented x 3. Normal speech and language.eye movements full without nystagmus. Face symmetric. Tongue midline. Normal strength, tone, reflexes and coordination. Normal sensation. Gait deferred.  ASSESSMENT Kristen Lee is a 71 y.o. 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 Infarcts appear to be secondary to small vessel disease, though CT reveals ? Lung cancer, so pt could be hypercoagulable. Also with lupus, RA. On no antiplatlets prior to admission. Now on aspirin 325 mg orally every day for secondary stroke prevention. Patient with resultant  imbalance.  -LUL mass, ? Bronchogenic carcinoma -lupus -RA  Hospital day # 1  TREATMENT/PLAN -Continue aspirin 325 mg orally every day for secondary stroke prevention. -recommend discussion with cardiologist related to 2D results -Pulmonary/oncology consult -therapy evals  Dr. Pearlean Brownie discussed with Dr. Gonzella Lex.  Joaquin Music, ANP-BC, GNP-BC [facility] Pager: [phone] 06/25/2011 8:27 AM  Scribe for Dr. Delia Heady, Stroke Center Medical Director. He has personally reviewed chart, pertinent data, examined the patient and developed the plan of care. Pager:  [phone]

## 2011-06-25 NOTE — Discharge Instructions (Signed)
STROKE/TIA DISCHARGE INSTRUCTIONS SMOKING Cigarette smoking nearly doubles your risk of having a stroke & is the single most alterable risk factor  If you smoke or have smoked in the last 12 months, you are advised to quit smoking for your health.  Most of the excess cardiovascular risk related to smoking disappears within a year of stopping.  Ask you doctor about anti-smoking medications  [facility] Quit Line: 1-800-QUIT NOW  Free Smoking Cessation Classes ([phone]  CHOLESTEROL Know your levels; limit fat & cholesterol in your diet  Lipid Panel     Component Value Date/Time   CHOL 212* 06/24/2011 1004   TRIG 131 06/24/2011 1004   HDL 57 06/24/2011 1004   CHOLHDL 3.7 06/24/2011 1004   VLDL 26 06/24/2011 1004   LDLCALC 129* 06/24/2011 1004      Many patients benefit from treatment even if their cholesterol is at goal.  Goal: Total Cholesterol (CHOL) less than 160  Goal:  Triglycerides (TRIG) less than 150  Goal:  HDL greater than 40  Goal:  LDL (LDLCALC) less than 100   BLOOD PRESSURE American Stroke Association blood pressure target is less that 120/80 mm/Hg  Your discharge blood pressure is:  BP: 160/98 mmHg  Monitor your blood pressure  Limit your salt and alcohol intake  Many individuals will require more than one medication for high blood pressure  DIABETES (A1c is a blood sugar average for last 3 months) Goal HGBA1c is under 7% (HBGA1c is blood sugar average for last 3 months)  Diabetes: {STROKE DC DIABETES:22357}    Lab Results  Component Value Date   HGBA1C 5.2 06/24/2011     Your HGBA1c can be lowered with medications, healthy diet, and exercise.  Check your blood sugar as directed by your physician  Call your physician if you experience unexplained or low blood sugars.  PHYSICAL ACTIVITY/REHABILITATION Goal is 30 minutes at least 4 days per week    {STROKE DC ACTIVITY/REHAB:22359}  Activity decreases your risk of heart attack and stroke and makes your heart  stronger.  It helps control your weight and blood pressure; helps you relax and can improve your mood.  Participate in a regular exercise program.  Talk with your doctor about the best form of exercise for you (dancing, walking, swimming, cycling).  DIET/WEIGHT Goal is to maintain a healthy weight  Your discharge diet is: Cardiac *** liquids Your height is:  Height: 5\' 4"  (162.6 cm) Your current weight is: Weight: 55.792 kg (123 lb) Your Body Mass Index (BMI) is:  BMI (Calculated): 21.2   Following the type of diet specifically designed for you will help prevent another stroke.  Your goal weight range is:  ***  Your goal Body Mass Index (BMI) is 19-24.  Healthy food habits can help reduce 3 risk factors for stroke:  High cholesterol, hypertension, and excess weight.  RESOURCES Stroke/Support Group:  Call [phone]  they meet the 3rd Sunday of the month on the Rehab Unit at [facility], [place] ( no meetings June, July & Aug).  STROKE EDUCATION PROVIDED/REVIEWED AND GIVEN TO PATIENT Stroke warning signs and symptoms How to activate emergency medical system (call 911). Medications prescribed at discharge. Need for follow-up after discharge. Personal risk factors for stroke. Pneumonia vaccine given:   {STROKE DC YES/NO/DATE:22363} Flu vaccine given:   {STROKE DC YES/NO/DATE:22363} My questions have been answered, the writing is legible, and I understand these instructions.  I will adhere to these goals & educational materials that have been provided  to me after my discharge from the hospital.

## 2011-06-26 ENCOUNTER — Telehealth: Payer: Self-pay | Admitting: Internal Medicine

## 2011-06-26 NOTE — Telephone Encounter (Signed)
Patient called stating that she just got out of the hospital with a light stroke and she would like to see Dr. Lovell Sheehan this week. I informed the patient that he does not have openings and offered to schedule with [facility]. Patient refused and stated she wants to talk to [facility]. Please assist.

## 2011-06-27 ENCOUNTER — Other Ambulatory Visit: Payer: Self-pay | Admitting: Cardiothoracic Surgery

## 2011-06-27 ENCOUNTER — Institutional Professional Consult (permissible substitution) (INDEPENDENT_AMBULATORY_CARE_PROVIDER_SITE_OTHER): Payer: MEDICARE | Admitting: Cardiothoracic Surgery

## 2011-06-27 ENCOUNTER — Encounter: Payer: Self-pay | Admitting: Cardiothoracic Surgery

## 2011-06-27 VITALS — BP 140/96 | HR 85 | Resp 16 | Ht 59.0 in | Wt 120.0 lb

## 2011-06-27 DIAGNOSIS — D381 Neoplasm of uncertain behavior of trachea, bronchus and lung: Secondary | ICD-10-CM

## 2011-06-27 DIAGNOSIS — R911 Solitary pulmonary nodule: Secondary | ICD-10-CM

## 2011-06-27 NOTE — Progress Notes (Signed)
[address]            [place] [address]          [phone]      MARCOS PELOSO [facility] Medical Record #[id number] Date of Birth: 02-10-1940  Referring: Eddie North, MD Primary Care: Carrie Mew, MD, MD  Chief Complaint:    Chief Complaint  Patient presents with  . Lung Mass    eval and treat...CT CHEST    History of Present Illness:    Patient is a 71 year old female who 3 days ago had an episode of dizziness and left arm and leg numbness and was evaluated by the stroke team at [place]. She was kept in the hospital approximately 24 hours during that time. A chest x-ray and CT scan of the chest was performed raising the issue of a new left lung nodule. The patient has a history of rheumatoid arthritis and has been on Enbrel for approximately 10 years she is not currently taking. She is a former smoker  but quit smoking 27 years ago.  The patient has had difficulty ambulating and is currently using a walker. She has known underlying COPD and occasionally wheezes.   Current Activity/ Functional Status: Patient is not independent with mobility/ambulation, transfers, ADL's, IADL's.   Past Medical History  Diagnosis Date  . Thyroid disease   . Lupus   . Arthritis     rheumatoid and osteoarthritis  . Chicken pox as a child  . Shingles   . Measles as a child  . Mumps as a child  . History of shingles 11/25/2010    Past Surgical History  Procedure Date  . Abdominal hysterectomy     partial still has ovaries  . Tonsillectomy and adenoidectomy as a child  . Removal of blockage exploratory lab lysis of adhesions 02-03-10    Family History  Problem Relation Age of Onset  . Heart disease Mother   . Hyperlipidemia Mother   . Hypertension Mother   . Emphysema Father     smoked  . Heart attack Father     massive  . Heart disease Father     MI  . Other Son     shingles  . Cancer Paternal Grandmother      History   Social History  . Marital Status: Married    Spouse Name: N/A    Number of Children: N/A  . Years of Education: N/A   Occupational History  . Worked at [employer]   Social History Main Topics  . Smoking status: Former Smoker -- 2.0 packs/day for 15 years    Types: Cigarettes    Quit date: 02/03/1990  . Smokeless tobacco: Never Used  . Alcohol Use: No  . Drug Use: No  . Sexually Active: No      History  Smoking status  . Former Smoker -- 2.0 packs/day for 15 years  . Types: Cigarettes  . Quit date: 02/03/1990  Smokeless tobacco  . Never Used    History  Alcohol Use No     Allergies  Allergen Reactions  . Penicillins Swelling    Current Outpatient Prescriptions  Medication Sig Dispense Refill  . aspirin 325 MG tablet Take 1 tablet (325 mg total) by mouth daily.  30 tablet  3  . calcium carbonate (OS-CAL) 600 MG TABS Take 600 mg by mouth daily.        Marland Kitchen  ferrous fumarate-b12-vitamic C-folic acid (TRINSICON / FOLTRIN) capsule Take 1 capsule by mouth daily before breakfast.      . Fluticasone-Salmeterol (ADVAIR) 100-50 MCG/DOSE AEPB Inhale 1 puff into the lungs every 12 (twelve) hours.      . folic acid (FOLVITE) 400 MCG tablet Take 400 mcg by mouth daily.        Marland Kitchen leflunomide (ARAVA) 20 MG tablet Take 20 mg by mouth daily.      Marland Kitchen levothyroxine (SYNTHROID, LEVOTHROID) 75 MCG tablet Take 1 tablet (75 mcg total) by mouth daily.  30 tablet  2  . omega-3 acid ethyl esters (LOVAZA) 1 G capsule Take 2 g by mouth 2 (two) times daily.      . simvastatin (ZOCOR) 20 MG tablet Take 1 tablet (20 mg total) by mouth every evening.  30 tablet  2  . tolterodine (DETROL LA) 4 MG 24 hr capsule Take 4 mg by mouth 2 (two) times daily.       . traMADol (ULTRAM) 50 MG tablet Take 50 mg by mouth every 6 (six) hours as needed. For pain      . venlafaxine XR (EFFEXOR-XR) 75 MG 24 hr capsule Take 75 mg by mouth daily.      . mupirocin ointment (BACTROBAN) 2 % Apply 1  application topically 3 (three) times daily.           Review of Systems:     Cardiac Review of Systems: Y or N  Chest Pain [  n  ]  Resting SOB [   ] Exertional SOB  [ y ]  Orthopnea Cove.Etienne  ]   Pedal Edema [ y  ]    Palpitations Milo.Brash  ] Syncope  [ n ]   Presyncope [ y ]  General Review of Systems: [Y] = yes [  ]=no Constitional: recent weight change [ y ]; anorexia [  ]; fatigue [  ]; nausea [  ]; night sweats [  ]; fever [  ]; or chills [  ];                                                                                                                                          Dental: poor dentition[ n ]; Last Dentist visit:   Eye : blurred vision [ y ]; diplopia [   ]; vision changes [  ];  Amaurosis fugax[y  ]; Resp: cough [  ];  wheezing[yn  ];  hemoptysis[  ]; shortness of breath[n  ]; paroxysmal nocturnal dyspnea[ n ]; dyspnea on exertion[y  ]; or orthopnea[y  ];  GI:  gallstones[  ], vomiting[  ];  dysphagia[  ]; melena[  ];  hematochezia [  ]; heartburn[  ];   Hx of  Colonoscopy[  ]; GU: kidney stones [  ]; hematuria[  ];   dysuria [  ];  nocturia[  ];  history of     obstruction [  ];             Skin: rash, swelling[  ];, hair loss[  ];  peripheral edema[  ];  or itching[  ]; Musculosketetal: myalgias[y  ];  joint swelling[y  ];  joint erythema[ y ];  joint pain[ y ];  back pain[  y];  Heme/Lymph: bruising[  ];  bleeding[  ];  anemia[  ];  Neuro: TIA[ n ];  headaches[ h ];  stroke[recent  ];  vertigo[ y ];  seizures[n  ];   paresthesias[ n ];  difficulty walking[ y ];  Psych:depression[y  ]; anxiety[  ];  Endocrine: diabetes[  ];  thyroid dysfunction[ y ];  Immunizations: Flu [  ]; Pneumococcal[  ];  Other:  Physical Exam: BP 140/96  Pulse 85  Resp 16  Ht 4\' 11"  (1.499 m)  Wt 120 lb (54.432 kg)  BMI 24.24 kg/m2  SpO2 94%  General appearance: alert, cooperative, appears older than stated age, cachectic, fatigued and no distress Neurologic: intact Heart: regular rate and  rhythm, S1, S2 normal, no murmur, click, rub or gallop and normal apical impulse Lungs: clear to auscultation bilaterally and normal percussion bilaterally Abdomen: soft, non-tender; bowel sounds normal; no masses,  no organomegaly Extremities: varicose veins noted and venous stasis dermatitis noted She has numerous scarred areas on the lower extremities from previous slowly here healing ulcerations.pex Diagnostic Studies & Laboratory data:     Recent Radiology Findings:  Ct Chest Wo Contrast  06/27/2011  **ADDENDUM** CREATED: 06/27/2011 12:10:48  I have reviewed this case with Dr. Nydia Bouton. There is a new 7mm superior segment left lower lobe pulmonary nodule on image number 23.  The patient is scheduled for a PET CT.  **END ADDENDUM** SIGNED BY: Cyndie Chime, M.D.   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  No pericardial mass.  **END ADDENDUM** SIGNED BY: Reyes Ivan, M.D.   06/25/2011  *RADIOLOGY REPORT*  Clinical Data: Evaluate chest anatomy.  Lesion pushing on the right atrium.  Fall.  CT CHEST WITHOUT CONTRAST  Technique:  Multidetector CT imaging of the chest was performed following the standard protocol without IV contrast.  Comparison: 11/15/2008 and 12/09/2006.  Findings: Mediastinal lymph nodes are not enlarged by CT size criteria.  Hilar regions are difficult to definitively evaluate without IV contrast.  No axillary adenopathy.  Atherosclerotic calcification of the arterial vasculature, including coronary arteries.  Heart size normal.  No pericardial effusion.  The right hemidiaphragm is elevated, with the liver exerting slight mass effect on the right heart border.  Biapical pleural parenchymal scarring, as before.  There is fairly diffuse but mild peribronchovascular nodularity and scattered bronchiectasis, progressive from 11/15/2008.  A cystic and solid spiculated nodule in the left upper lobe measures  approximately 0.7 x 1.9 cm (images 17 18).  The lesion appears to have progressed from 11/15/2008.  There is chronic atelectasis and bronchiectasis in the right middle lobe and right lower lobe, adjacent to an elevated right hemidiaphragm.  No pleural fluid.  Airway is unremarkable.  Incidental imaging of the upper abdomen shows no acute findings. No worrisome lytic or sclerotic lesions.  IMPRESSION:  1.  Elevated right hemidiaphragm.  Liver exerts slight mass effect on the right heart border.  No mass. 2.  Spiculated left upper lobe nodule, containing cystic and solid components, which appears progressive from 11/15/2008.  Finding is worrisome for  primary bronchogenic carcinoma. Consultation with the [facility] [phone]) should be considered. These results will be called to the ordering clinician or representative by the Radiologist Assistant, and communication documented in the PACS Dashboard. 3.  Mildly progressive pattern of peribronchovascular nodularity and bronchiectasis, indicative of mycobacterium avium complex (MAC).  Original Report Authenticated By: P. Loralie Champagne, M.D.   Mr Brain Wo Contrast  06/24/2011  *RADIOLOGY REPORT*  Clinical Data:  Acute left sided numbness and weakness. Hyperlipidemia.  MRI HEAD WITHOUT CONTRAST MRA HEAD WITHOUT CONTRAST  Technique: Multiplanar, multiecho pulse sequences of the brain and surrounding structures were obtained according to standard protocol without intravenous contrast.  Angiographic images of the head were obtained using MRA technique without contrast.  Comparison: None.  MRI HEAD  Findings:  Small acute non hemorrhagic infarcts anterior right pons and mid right paracentral pontine region.  Several small remote infarcts within the cerebellum bilaterally.  Moderate small vessel disease type changes.  Global atrophy without hydrocephalus.  No intracranial hemorrhage.  No intracranial mass lesion detected on this  unenhanced exam.  Partial opacification left mastoid air cells.  No obstructing lesion causing eustachian tube dysfunction detected.  Partial opacification/mucosal thickening ethmoid sinus air cells.  Mild transverse ligament hypertrophy.  Mild cervical spondylotic changes C3-4 and C4-5.  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  Several small remote infarcts within the cerebellum bilaterally.  Moderate small vessel disease type changes.  Global atrophy without hydrocephalus.  Partial opacification left mastoid air cells.  MRA HEAD  Findings: Ectatic irregular cavernous and supraclinoid segment of the internal carotid artery bilaterally with there is mild narrowing but without high-grade stenosis.  Ectatic A1 segment of the right anterior cerebral artery.  Middle cerebral artery and A2 segment anterior cerebral artery branch vessel mild to slightly moderate irregularity and narrowing.  Right vertebral artery is dominant and ectatic with mild irregularity.  Poor delineation right PICA.  Moderate long segment narrowing of the distal left vertebral artery.  Ectatic basilar artery with mild irregularity without high-grade stenosis.  Poor delineation left AICA.  Mild ectasia posterior cerebral arteries with distal branch vessel irregularity and narrowing.  Mild bulge distal M1 segment right middle cerebral artery from which a vessel arises without discrete saccular aneurysm or vascular malformation.  IMPRESSION: Intracranial atherosclerotic type changes as detailed above.  Original Report Authenticated By: Fuller Canada, M.D.   Mr Mra Head/brain Wo Cm  06/24/2011  *RADIOLOGY REPORT*  Clinical Data:  Acute left sided numbness and weakness. Hyperlipidemia.  MRI HEAD WITHOUT CONTRAST MRA HEAD WITHOUT CONTRAST  Technique: Multiplanar, multiecho pulse sequences of the brain and surrounding structures were obtained according to standard protocol without  intravenous contrast.  Angiographic images of the head were obtained using MRA technique without contrast.  Comparison: None.  MRI HEAD  Findings:  Small acute non hemorrhagic infarcts anterior right pons and mid right paracentral pontine region.  Several small remote infarcts within the cerebellum bilaterally.  Moderate small vessel disease type changes.  Global atrophy without hydrocephalus.  No intracranial hemorrhage.  No intracranial mass lesion detected on this unenhanced exam.  Partial opacification left mastoid air cells.  No obstructing lesion causing eustachian tube dysfunction detected.  Partial opacification/mucosal thickening ethmoid sinus air cells.  Mild transverse ligament hypertrophy.  Mild cervical spondylotic changes C3-4 and C4-5.  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  Several small remote infarcts  within the cerebellum bilaterally.  Moderate small vessel disease type changes.  Global atrophy without hydrocephalus.  Partial opacification left mastoid air cells.  MRA HEAD  Findings: Ectatic irregular cavernous and supraclinoid segment of the internal carotid artery bilaterally with there is mild narrowing but without high-grade stenosis.  Ectatic A1 segment of the right anterior cerebral artery.  Middle cerebral artery and A2 segment anterior cerebral artery branch vessel mild to slightly moderate irregularity and narrowing.  Right vertebral artery is dominant and ectatic with mild irregularity.  Poor delineation right PICA.  Moderate long segment narrowing of the distal left vertebral artery.  Ectatic basilar artery with mild irregularity without high-grade stenosis.  Poor delineation left AICA.  Mild ectasia posterior cerebral arteries with distal branch vessel irregularity and narrowing.  Mild bulge distal M1 segment right middle cerebral artery from which a vessel arises without discrete saccular aneurysm or vascular  malformation.  IMPRESSION: Intracranial atherosclerotic type changes as detailed above.  Original Report Authenticated By: Fuller Canada, M.D.    Recent Lab Findings: Lab Results  Component Value Date   WBC 3.6* 06/24/2011   HGB 13.2 06/24/2011   HCT 39.4 06/24/2011   PLT 147* 06/24/2011   GLUCOSE 88 06/24/2011   CHOL 194 06/25/2011   TRIG 123 06/25/2011   HDL 51 06/25/2011   LDLDIRECT 144.8 11/21/2010   LDLCALC 118* 06/25/2011   ALT 11 06/24/2011   AST 16 06/24/2011   NA 137 06/24/2011   K 4.3 06/24/2011   CL 99 06/24/2011   CREATININE 0.66 06/24/2011   BUN 12 06/24/2011   CO2 28 06/24/2011   TSH 5.076* 06/24/2011   INR 1.12 06/24/2011   HGBA1C 5.3 06/25/2011      Assessment / Plan:     Patient is a 71 year old former smoker, on long-term enbral for rheumatoid arthritis She's had a recent stroke involving the left arm and leg, and incidentally found to have a left upper lobe lung nodule that has increased in size since 2010 and a new lung nodule in the superior segment of the left lower lobe that is new since 2010. It is unclear if these are related to her rheumatoid disease or development of the underlying malignancy. She is just recovering from a stroke.   I discussed the new findings of this CT scan with the patient and her family I've recommended that we proceed with pulmonary function studies to have a basis of her underlying pulmonary function and also a PET scan I will see her back in the office after the completion of these studies to determine if and how to obtain a tissue diagnosis.        Delight Ovens MD  Beeper [phone] Office [phone] 06/27/2011 3:17 PM

## 2011-07-01 ENCOUNTER — Telehealth: Payer: Self-pay | Admitting: Internal Medicine

## 2011-07-01 NOTE — Telephone Encounter (Signed)
Kristen Lee from [facility] requesting to be contacted about patients physical therapy. Pt is scheduled for a ov tomorrow, pt has an ulcer in between her toes, home health nurse would like to know if it is something that she needs to treat as well. Please contact adrian @ [phone]

## 2011-07-01 NOTE — Telephone Encounter (Signed)
Ok with dr Lovell Sheehan to schedule for 4:15 on Tuesday 5-29--can you put in ?thanks

## 2011-07-01 NOTE — Telephone Encounter (Signed)
Latoya scheduled pt. Thanks!

## 2011-07-01 NOTE — Telephone Encounter (Signed)
Fungus or infection??clean and dress and dr Lovell Sheehan will look at it tomorrow --Left message on machine For [facility] nurse

## 2011-07-02 ENCOUNTER — Ambulatory Visit: Payer: MEDICARE | Admitting: Internal Medicine

## 2011-07-03 ENCOUNTER — Encounter: Payer: Self-pay | Admitting: Internal Medicine

## 2011-07-03 ENCOUNTER — Ambulatory Visit (INDEPENDENT_AMBULATORY_CARE_PROVIDER_SITE_OTHER): Payer: MEDICARE | Admitting: Internal Medicine

## 2011-07-03 VITALS — BP 154/84 | HR 76 | Temp 98.2°F | Resp 16 | Ht 59.0 in | Wt 119.0 lb

## 2011-07-03 DIAGNOSIS — F329 Major depressive disorder, single episode, unspecified: Secondary | ICD-10-CM

## 2011-07-03 DIAGNOSIS — I10 Essential (primary) hypertension: Secondary | ICD-10-CM | POA: Insufficient documentation

## 2011-07-03 DIAGNOSIS — E039 Hypothyroidism, unspecified: Secondary | ICD-10-CM

## 2011-07-03 DIAGNOSIS — J984 Other disorders of lung: Secondary | ICD-10-CM

## 2011-07-03 DIAGNOSIS — E785 Hyperlipidemia, unspecified: Secondary | ICD-10-CM

## 2011-07-03 DIAGNOSIS — L97909 Non-pressure chronic ulcer of unspecified part of unspecified lower leg with unspecified severity: Secondary | ICD-10-CM

## 2011-07-03 LAB — CBC WITH DIFFERENTIAL/PLATELET
Basophils Relative: 0.7 % (ref 0.0–3.0)
Eosinophils Absolute: 0.1 10*3/uL (ref 0.0–0.7)
Eosinophils Relative: 3.1 % (ref 0.0–5.0)
HCT: 40.5 % (ref 36.0–46.0)
Hemoglobin: 13.3 g/dL (ref 12.0–15.0)
MCHC: 32.9 g/dL (ref 30.0–36.0)
MCV: 102 fl — ABNORMAL HIGH (ref 78.0–100.0)
Monocytes Absolute: 0.4 10*3/uL (ref 0.1–1.0)
Neutro Abs: 2.8 10*3/uL (ref 1.4–7.7)
RBC: 3.97 Mil/uL (ref 3.87–5.11)
WBC: 4.6 10*3/uL (ref 4.5–10.5)

## 2011-07-03 LAB — BASIC METABOLIC PANEL
BUN: 18 mg/dL (ref 6–23)
Chloride: 104 mEq/L (ref 96–112)
Creatinine, Ser: 0.7 mg/dL (ref 0.4–1.2)
GFR: 84.32 mL/min (ref >60.00)
Glucose, Bld: 80 mg/dL (ref 70–99)

## 2011-07-03 LAB — T3, FREE: T3, Free: 2.5 pg/mL (ref 2.3–4.2)

## 2011-07-03 MED ORDER — VENLAFAXINE HCL ER 75 MG PO CP24: 75.0000 mg | ORAL_CAPSULE | Freq: Every day | ORAL | Status: DC

## 2011-07-03 MED ORDER — OLMESARTAN MEDOXOMIL 20 MG PO TABS: 20.0000 mg | ORAL_TABLET | Freq: Every day | ORAL | Status: DC

## 2011-07-03 NOTE — Assessment & Plan Note (Signed)
The patient has a lung nodule that has been seen in 2008 and was noted again and a CT scan done at the hospital there was a question of progression of the size of the nodule so Dr. Tyrone Sage has been consulted to follow a multi-disciplinary lung nodule clinic.

## 2011-07-03 NOTE — Progress Notes (Signed)
Subjective:    Patient ID: Kristen Lee, female    DOB: [birth year], 71 y.o.   MRN: [id number]  HPI  Patient is a 71 year old female seen post hospitalization and was discharged on May 22 after having experienced a stroke.  She has risk factors for stroke including hyperlipidemia probable peripheral vascular disease probable cerebral vascular disease and hypertension.  Home health care was consulted to monitor both her blood pressure and to help her with stroke rehabilitation.  She is now walking with a rolling walker but has increased fall risk. She experienced a left hemiparesis with residual weakness. Speech mental capacity appears to be good.  She had a CT scan of the chest which revealed a nodule that appeared to be progressive and has a followup with Dr. Tyrone Sage of vascular surgery.  She has an ulcer  ( vascular vs vasculitis) on the left  Foot ( hammertoe) She brings with her a record of elevated blood pressure from the home healthcare nurse from the 23rd of the 29th she has had only one normal recorded blood pressure on the systolic reading. She was not placed on any anticoagulation other than aspirin. 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   Review of Systems  Constitutional: Positive for activity change and fatigue. Negative for appetite change.  HENT: Positive for hearing loss. Negative for ear pain, congestion, neck pain, postnasal drip and sinus pressure.   Eyes: Negative for redness and visual disturbance.  Respiratory: Negative for cough, shortness of breath and wheezing.   Cardiovascular: Positive for palpitations.  Gastrointestinal: Negative for abdominal pain and abdominal distention.  Genitourinary: Negative for dysuria, frequency and menstrual problem.  Musculoskeletal: Negative for myalgias, joint swelling and arthralgias.  Skin: Negative for rash and wound.  Neurological: Positive for weakness and light-headedness. Negative for  dizziness and headaches.  Hematological: Negative for adenopathy. Does not bruise/bleed easily.  Psychiatric/Behavioral: Negative for sleep disturbance and decreased concentration.   Past Medical History  Diagnosis Date  . Thyroid disease   . Lupus   . Arthritis     rheumatoid and osteoarthritis  . Chicken pox as a child  . Shingles   . Measles as a child  . Mumps as a child  . History of shingles 11/25/2010    History   Social History  . Marital Status: Married    Spouse Name: N/A    Number of Children: N/A  . Years of Education: N/A   Occupational History  . Not on file.   Social History Main Topics  . Smoking status: Former Smoker -- 2.0 packs/day for 15 years    Types: Cigarettes    Quit date: 02/03/1990  . Smokeless tobacco: Never Used  . Alcohol Use: No  . Drug Use: No  . Sexually Active: No   Other Topics Concern  . Not on file   Social History Narrative  . No narrative on file    Past Surgical History  Procedure Date  . Abdominal hysterectomy     partial still has ovaries  . Tonsillectomy and adenoidectomy as a child  . Removal of blockage 02-03-10    Family History  Problem Relation Age of Onset  . Heart disease Mother   . Hyperlipidemia Mother   . Hypertension Mother   . Emphysema Father     smoked  . Heart attack Father     massive  . Heart disease Father     MI  . Other Son  shingles  . Cancer Paternal Grandmother     Allergies  Allergen Reactions  . Penicillins Swelling    Current Outpatient Prescriptions on File Prior to Visit  Medication Sig Dispense Refill  . aspirin 325 MG tablet Take 1 tablet (325 mg total) by mouth daily.  30 tablet  3  . calcium carbonate (OS-CAL) 600 MG TABS Take 600 mg by mouth daily.        . ferrous fumarate-b12-vitamic C-folic acid (TRINSICON / FOLTRIN) capsule Take 1 capsule by mouth daily before breakfast.      . Fluticasone-Salmeterol (ADVAIR) 100-50 MCG/DOSE AEPB Inhale 1 puff into the lungs  every 12 (twelve) hours.      . folic acid (FOLVITE) 400 MCG tablet Take 400 mcg by mouth daily.        Marland Kitchen leflunomide (ARAVA) 20 MG tablet Take 20 mg by mouth daily.      Marland Kitchen levothyroxine (SYNTHROID, LEVOTHROID) 75 MCG tablet Take 1 tablet (75 mcg total) by mouth daily.  30 tablet  2  . simvastatin (ZOCOR) 20 MG tablet Take 1 tablet (20 mg total) by mouth every evening.  30 tablet  2  . tolterodine (DETROL LA) 4 MG 24 hr capsule Take 4 mg by mouth 2 (two) times daily.       . traMADol (ULTRAM) 50 MG tablet Take 50 mg by mouth every 6 (six) hours as needed. For pain      . DISCONTD: venlafaxine XR (EFFEXOR-XR) 75 MG 24 hr capsule Take 75 mg by mouth daily.        BP 154/84  Pulse 76  Temp 98.2 F (36.8 C)  Resp 16  Ht 4\' 11"  (1.499 m)  Wt 119 lb (53.978 kg)  BMI 24.04 kg/m2       Objective:   Physical Exam  Nursing note and vitals reviewed. Constitutional: She is oriented to person, place, and time. She appears well-developed. No distress.  HENT:  Head: Normocephalic and atraumatic.  Right Ear: External ear normal.  Left Ear: External ear normal.  Nose: Nose normal.  Mouth/Throat: Oropharynx is clear and moist.  Eyes: Pupils are equal, round, and reactive to light.  Neck: Normal range of motion. Neck supple. No JVD present. No tracheal deviation present. No thyromegaly present.  Cardiovascular: Normal rate, regular rhythm, normal heart sounds and intact distal pulses.   No murmur heard. Pulmonary/Chest: Effort normal and breath sounds normal. She has no wheezes. She exhibits no tenderness.  Abdominal: Soft. Bowel sounds are normal.  Musculoskeletal: She exhibits no edema and no tenderness.  Lymphadenopathy:    She has no cervical adenopathy.  Neurological: She is alert and oriented to person, place, and time. She displays abnormal reflex. No cranial nerve deficit. She exhibits abnormal muscle tone. Coordination abnormal.  Skin: Skin is warm and dry. She is not diaphoretic.        There is a punctate area of ulceration on the third toe. There is no discoloration or cyanosis          Assessment & Plan:  Differential diagnosis of the lung nodule includes rheumatoid nodule. Agree with monitoring by Dr. Tyrone Sage of vascular surgery.  If he believes that serial CTs are needed we would be happy to manage that from a primary care standpoint.  The patient's residual deficit from the stroke is mild and she continues to have home health care and home physical therapy I would recommend however due to fall risk that she continue with a walker.  Blood pressure remains elevated with no prior history of significant elevations of blood pressure in review of her connective tissue disorder I believe that an ACE or an ARB would be appropriate blood pressure intervention we'll begin low-dose Benicar 20 mg by mouth daily with samples.  Agree with aggressive cholesterol lowering although plaque etiology for the stroke is not clear. Echocardiogram did not show an embolic source and MRA did not show prominent vertebrobasilar or carotid disease. Of note was there was some mild thrombocythemia . The area of ulceration of the toe appears to be chronic contact ulceration rather than vascular she has reasonable capillary refill Monitoring today of a basic metabolic panel a complete thyroid panel and a CBC is indicated I have spent 45 min of which more that 1/2 was in counseling.

## 2011-07-03 NOTE — Patient Instructions (Signed)
Start Benicar 20 mg one by mouth daily in the morning keep home healthcare monitoring of blood pressure

## 2011-07-05 DIAGNOSIS — I639 Cerebral infarction, unspecified: Secondary | ICD-10-CM

## 2011-07-05 HISTORY — DX: Cerebral infarction, unspecified: I63.9

## 2011-07-08 ENCOUNTER — Encounter (HOSPITAL_COMMUNITY)
Admission: RE | Admit: 2011-07-08 | Discharge: 2011-07-08 | Disposition: A | Discharge status: Home / Self Care | Payer: MEDICARE | Source: Ambulatory Visit | Attending: Cardiothoracic Surgery | Admitting: Cardiothoracic Surgery

## 2011-07-08 ENCOUNTER — Encounter (HOSPITAL_COMMUNITY): Payer: Self-pay

## 2011-07-08 ENCOUNTER — Ambulatory Visit (HOSPITAL_COMMUNITY)
Admission: RE | Admit: 2011-07-08 | Discharge: 2011-07-08 | Disposition: A | Discharge status: Home / Self Care | Payer: MEDICARE | Source: Ambulatory Visit | Attending: Cardiothoracic Surgery | Admitting: Cardiothoracic Surgery

## 2011-07-08 DIAGNOSIS — I700 Atherosclerosis of aorta: Secondary | ICD-10-CM | POA: Insufficient documentation

## 2011-07-08 DIAGNOSIS — D381 Neoplasm of uncertain behavior of trachea, bronchus and lung: Secondary | ICD-10-CM | POA: Insufficient documentation

## 2011-07-08 DIAGNOSIS — I2510 Atherosclerotic heart disease of native coronary artery without angina pectoris: Secondary | ICD-10-CM | POA: Insufficient documentation

## 2011-07-08 DIAGNOSIS — R911 Solitary pulmonary nodule: Secondary | ICD-10-CM | POA: Insufficient documentation

## 2011-07-08 HISTORY — DX: Other nonspecific abnormal finding of lung field: R91.8

## 2011-07-08 LAB — PULMONARY FUNCTION TEST

## 2011-07-08 LAB — GLUCOSE, CAPILLARY: Glucose-Capillary: 92 mg/dL (ref 70–99)

## 2011-07-08 MED ORDER — FLUDEOXYGLUCOSE F - 18 (FDG) INJECTION
18.6000 | Freq: Once | INTRAVENOUS | Status: AC | PRN
  Administered 2011-07-08: 18.6 via INTRAVENOUS

## 2011-07-08 MED ORDER — ALBUTEROL SULFATE (5 MG/ML) 0.5% IN NEBU
2.5000 mg | INHALATION_SOLUTION | Freq: Once | RESPIRATORY_TRACT | Status: AC
  Administered 2011-07-08: 2.5 mg via RESPIRATORY_TRACT

## 2011-07-15 ENCOUNTER — Encounter: Payer: Self-pay | Admitting: Cardiothoracic Surgery

## 2011-07-15 ENCOUNTER — Ambulatory Visit (INDEPENDENT_AMBULATORY_CARE_PROVIDER_SITE_OTHER): Payer: MEDICARE | Admitting: Cardiothoracic Surgery

## 2011-07-15 VITALS — BP 130/76 | HR 96 | Resp 20 | Ht 59.0 in | Wt 120.0 lb

## 2011-07-15 DIAGNOSIS — R911 Solitary pulmonary nodule: Secondary | ICD-10-CM

## 2011-07-15 NOTE — Progress Notes (Signed)
[address]            [place] [address]          [phone]      Kristen Lee [facility] Medical Record #[id number] Date of Birth: 21-Aug-1940  Referring: Eddie North, MD Primary Care: Carrie Mew, MD, MD  Chief Complaint:    Chief Complaint  Patient presents with  . Lung Lesion    Further discuss surgery. S/P PFT's and PET Scan on 07/08/11     History of Present Illness:    Patient is a 71 year old female who 3 days ago had an episode of dizziness and left arm and leg numbness and was evaluated by the stroke team at [facility]. She was kept in the hospital approximately 24 hours during that time. A chest x-ray and CT scan of the chest was performed raising the issue of a new left lung nodule. The patient has a history of rheumatoid arthritis and has been on Enbrel for approximately 10 years she is not currently taking. She is a former smoker  but quit smoking 27 years ago.  The patient has had difficulty ambulating and is currently using a walker. She has known underlying COPD and occasionally wheezes. Patient notes that she had increased wheezing this morning used her inhalers  Current Activity/ Functional Status: Patient is not independent with mobility/ambulation, transfers, ADL's, IADL's.   Past Medical History  Diagnosis Date  . Thyroid disease   . Lupus   . Arthritis     rheumatoid and osteoarthritis  . Chicken pox as a child  . Shingles   . Measles as a child  . Mumps as a child  . History of shingles 11/25/2010  . Lung mass     Past Surgical History  Procedure Date  . Abdominal hysterectomy     partial still has ovaries  . Tonsillectomy and adenoidectomy as a child  . Removal of blockage exploratory lab lysis of adhesions 02-03-10    Family History  Problem Relation Age of Onset  . Heart disease Mother   . Hyperlipidemia Mother   . Hypertension Mother   . Emphysema Father     smoked  . Heart  attack Father     massive  . Heart disease Father     MI  . Other Son     shingles  . Cancer Paternal Grandmother     History   Social History  . Marital Status: Married    Spouse Name: N/A    Number of Children: N/A  . Years of Education: N/A   Occupational History  . Worked at [employer]   Social History Main Topics  . Smoking status: Former Smoker -- 2.0 packs/day for 15 years    Types: Cigarettes    Quit date: 02/03/1990  . Smokeless tobacco: Never Used  . Alcohol Use: No  . Drug Use: No  . Sexually Active: No      History  Smoking status  . Former Smoker -- 2.0 packs/day for 15 years  . Types: Cigarettes  . Quit date: 02/03/1990  Smokeless tobacco  . Never Used    History  Alcohol Use No     Allergies  Allergen Reactions  . Penicillins Swelling    Current Outpatient Prescriptions  Medication Sig Dispense Refill  . aspirin 325 MG tablet Take 1 tablet (325 mg total) by  mouth daily.  30 tablet  3  . calcium carbonate (OS-CAL) 600 MG TABS Take 600 mg by mouth daily.        . ferrous fumarate-b12-vitamic C-folic acid (TRINSICON / FOLTRIN) capsule Take 1 capsule by mouth daily before breakfast.      . Fluticasone-Salmeterol (ADVAIR) 100-50 MCG/DOSE AEPB Inhale 1 puff into the lungs every 12 (twelve) hours.      . folic acid (FOLVITE) 400 MCG tablet Take 400 mcg by mouth daily.        Marland Kitchen leflunomide (ARAVA) 20 MG tablet Take 20 mg by mouth daily.      Marland Kitchen levothyroxine (SYNTHROID, LEVOTHROID) 75 MCG tablet Take 1 tablet (75 mcg total) by mouth daily.  30 tablet  2  . olmesartan (BENICAR) 20 MG tablet Take 1 tablet (20 mg total) by mouth daily.      . simvastatin (ZOCOR) 20 MG tablet Take 1 tablet (20 mg total) by mouth every evening.  30 tablet  2  . tolterodine (DETROL LA) 4 MG 24 hr capsule Take 4 mg by mouth 2 (two) times daily.       . traMADol (ULTRAM) 50 MG tablet Take 50 mg by mouth every 6 (six) hours as needed. For pain      . venlafaxine XR  (EFFEXOR-XR) 75 MG 24 hr capsule Take 1 capsule (75 mg total) by mouth daily.  90 capsule  3       Review of Systems:     Cardiac Review of Systems: Y or N  Chest Pain [  n  ]  Resting SOB [   ] Exertional SOB  [ y ]  Orthopnea Cove.Etienne  ]   Pedal Edema [ y  ]    Palpitations Milo.Brash  ] Syncope  [ n ]   Presyncope [ y ]  General Review of Systems: [Y] = yes [  ]=no Constitional: recent weight change [ y ]; anorexia [  ]; fatigue [  ]; nausea [  ]; night sweats [  ]; fever [  ]; or chills [  ];                                                                                                                                          Dental: poor dentition[ n ]; Last Dentist visit:   Eye : blurred vision [ y ]; diplopia [   ]; vision changes [  ];  Amaurosis fugax[y  ]; Resp: cough [  ];  wheezing[yn  ];  hemoptysis[  ]; shortness of breath[n  ]; paroxysmal nocturnal dyspnea[ n ]; dyspnea on exertion[y  ]; or orthopnea[y  ];  GI:  gallstones[  ], vomiting[  ];  dysphagia[  ]; melena[  ];  hematochezia [  ]; heartburn[  ];   Hx of  Colonoscopy[  ]; GU: kidney stones [  ]; hematuria[  ];  dysuria [  ];  nocturia[  ];  history of     obstruction [  ];             Skin: rash, swelling[  ];, hair loss[  ];  peripheral edema[  ];  or itching[  ]; Musculosketetal: myalgias[y  ];  joint swelling[y  ];  joint erythema[ y ];  joint pain[ y ];  back pain[  y];  Heme/Lymph: bruising[  ];  bleeding[  ];  anemia[  ];  Neuro: TIA[ n ];  headaches[ h ];  stroke[recent  ];  vertigo[ y ];  seizures[n  ];   paresthesias[ n ];  difficulty walking[ y ];  Psych:depression[y  ]; anxiety[  ];  Endocrine: diabetes[  ];  thyroid dysfunction[ y ];  Immunizations: Flu [  ]; Pneumococcal[  ];  Other:  Physical Exam: BP 130/76  Pulse 96  Resp 20  Ht 4\' 11"  (1.499 m)  Wt 120 lb (54.432 kg)  BMI 24.24 kg/m2  SpO2 98%  General appearance: alert, cooperative, appears older than stated age, cachectic, fatigued and no  distress Neurologic: intact Heart: regular rate and rhythm, S1, S2 normal, no murmur, click, rub or gallop and normal apical impulse Lungs: clear to auscultation bilaterally and normal percussion bilaterally Abdomen: soft, non-tender; bowel sounds normal; no masses,  no organomegaly Extremities: varicose veins noted and venous stasis dermatitis noted She has numerous scarred areas on the lower extremities from previous slowly here healing ulcerations   Diagnostic Studies & Laboratory data:   PFTs are performed FEV1 is 0.98 fusion capacity of 44% moderately severe obstructive airway disease and severe diffusion defect    Recent Radiology Findings:  No results found. Ct Chest Wo Contrast  06/27/2011  **ADDENDUM** CREATED: 06/27/2011 12:10:48  I have reviewed this case with Dr. Nydia Bouton. There is a new 7mm superior segment left lower lobe pulmonary nodule on image number 23.  The patient is scheduled for a PET CT.  **END ADDENDUM** SIGNED BY: Cyndie Chime, M.D.   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  No pericardial mass.  **END ADDENDUM** SIGNED BY: Reyes Ivan, M.D.   06/25/2011  *RADIOLOGY REPORT*  Clinical Data: Evaluate chest anatomy.  Lesion pushing on the right atrium.  Fall.  CT CHEST WITHOUT CONTRAST  Technique:  Multidetector CT imaging of the chest was performed following the standard protocol without IV contrast.  Comparison: 11/15/2008 and 12/09/2006.  Findings: Mediastinal lymph nodes are not enlarged by CT size criteria.  Hilar regions are difficult to definitively evaluate without IV contrast.  No axillary adenopathy.  Atherosclerotic calcification of the arterial vasculature, including coronary arteries.  Heart size normal.  No pericardial effusion.  The right hemidiaphragm is elevated, with the liver exerting slight mass effect on the right heart border.  Biapical pleural parenchymal scarring, as  before.  There is fairly diffuse but mild peribronchovascular nodularity and scattered bronchiectasis, progressive from 11/15/2008.  A cystic and solid spiculated nodule in the left upper lobe measures approximately 0.7 x 1.9 cm (images 17 18).  The lesion appears to have progressed from 11/15/2008.  There is chronic atelectasis and bronchiectasis in the right middle lobe and right lower lobe, adjacent to an elevated right hemidiaphragm.  No pleural fluid.  Airway is unremarkable.  Incidental imaging of the upper abdomen shows no acute findings. No worrisome lytic or sclerotic lesions.  IMPRESSION:  1.  Elevated right hemidiaphragm.  Liver  exerts slight mass effect on the right heart border.  No mass. 2.  Spiculated left upper lobe nodule, containing cystic and solid components, which appears progressive from 11/15/2008.  Finding is worrisome for primary bronchogenic carcinoma. Consultation with the [facility] [phone]) should be considered. These results will be called to the ordering clinician or representative by the Radiologist Assistant, and communication documented in the PACS Dashboard. 3.  Mildly progressive pattern of peribronchovascular nodularity and bronchiectasis, indicative of mycobacterium avium complex (MAC).  Original Report Authenticated By: P. Loralie Champagne, M.D.   Mr Brain Wo Contrast  06/24/2011  *RADIOLOGY REPORT*  Clinical Data:  Acute left sided numbness and weakness. Hyperlipidemia.  MRI HEAD WITHOUT CONTRAST MRA HEAD WITHOUT CONTRAST  Technique: Multiplanar, multiecho pulse sequences of the brain and surrounding structures were obtained according to standard protocol without intravenous contrast.  Angiographic images of the head were obtained using MRA technique without contrast.  Comparison: None.  MRI HEAD  Findings:  Small acute non hemorrhagic infarcts anterior right pons and mid right paracentral pontine region.  Several small remote infarcts  within the cerebellum bilaterally.  Moderate small vessel disease type changes.  Global atrophy without hydrocephalus.  No intracranial hemorrhage.  No intracranial mass lesion detected on this unenhanced exam.  Partial opacification left mastoid air cells.  No obstructing lesion causing eustachian tube dysfunction detected.  Partial opacification/mucosal thickening ethmoid sinus air cells.  Mild transverse ligament hypertrophy.  Mild cervical spondylotic changes C3-4 and C4-5.  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  Several small remote infarcts within the cerebellum bilaterally.  Moderate small vessel disease type changes.  Global atrophy without hydrocephalus.  Partial opacification left mastoid air cells.  MRA HEAD  Findings: Ectatic irregular cavernous and supraclinoid segment of the internal carotid artery bilaterally with there is mild narrowing but without high-grade stenosis.  Ectatic A1 segment of the right anterior cerebral artery.  Middle cerebral artery and A2 segment anterior cerebral artery branch vessel mild to slightly moderate irregularity and narrowing.  Right vertebral artery is dominant and ectatic with mild irregularity.  Poor delineation right PICA.  Moderate long segment narrowing of the distal left vertebral artery.  Ectatic basilar artery with mild irregularity without high-grade stenosis.  Poor delineation left AICA.  Mild ectasia posterior cerebral arteries with distal branch vessel irregularity and narrowing.  Mild bulge distal M1 segment right middle cerebral artery from which a vessel arises without discrete saccular aneurysm or vascular malformation.  IMPRESSION: Intracranial atherosclerotic type changes as detailed above.  Original Report Authenticated By: Fuller Canada, M.D.   Nm Pet Image Initial (pi) Skull Base To Thigh  07/08/2011  *RADIOLOGY REPORT*  Clinical Data: Initial treatment strategy for lung  nodule.  NUCLEAR MEDICINE PET SKULL BASE TO THIGH  Fasting Blood Glucose:  92  Technique:  18.6 mCi F-18 FDG was injected intravenously. CT data was obtained and used for attenuation correction and anatomic localization only.  (This was not acquired as a diagnostic CT examination.) Additional exam technical data entered on technologist worksheet.  Comparison:  CT chest dated 06/24/2011  Findings:  Neck: No hypermetabolic lymph nodes in the neck.  Chest:  13 x 7 mm nodule with central lucency in the left upper lobe (series 2/image 69), max SUV 2.7, suspicious for primary bronchogenic neoplasm.  Additional 8 mm nodule in the superior segment left lower lobe (series 2/image 73), max SUV 1.7, also suspicious for primary bronchogenic  neoplasm.  4 mm nodule in the left lower lobe (series 2/image 90), unchanged from remote prior studies, likely benign.  No hypermetabolic mediastinal or hilar nodes.  Coronary atherosclerosis.  Abdomen/Pelvis:  No abnormal hypermetabolic activity within the liver, pancreas, adrenal glands, or spleen.  Scattered areas of nonspecific GI uptake.  No hypermetabolic lymph nodes in the abdomen or pelvis.  Atherosclerotic calcifications of the aortic arch and branch vessels.  [place]:  No focal hypermetabolic activity to suggest skeletal metastasis.  Old left superior and inferior pubic ramus fracture deformities.  IMPRESSION: 13 mm nodule with central lucency in the left upper lobe, max SUV 2.7, suspicious for primary bronchogenic neoplasm.  Additional 8 mm nodule in the superior segment left lower lobe, max SUV 1.7, suspicious for synchronous primary bronchogenic neoplasm.  No evidence of metastatic disease.  Original Report Authenticated By: Charline Bills, M.D.    Recent Lab Findings: Lab Results  Component Value Date   WBC 4.6 07/03/2011   HGB 13.3 07/03/2011   HCT 40.5 07/03/2011   PLT 153.0 07/03/2011   GLUCOSE 80 07/03/2011   CHOL 194 06/25/2011   TRIG 123 06/25/2011   HDL 51  06/25/2011   LDLDIRECT 144.8 11/21/2010   LDLCALC 118* 06/25/2011   ALT 11 06/24/2011   AST 16 06/24/2011   NA 140 07/03/2011   K 4.3 07/03/2011   CL 104 07/03/2011   CREATININE 0.7 07/03/2011   BUN 18 07/03/2011   CO2 27 07/03/2011   TSH 5.076* 06/24/2011   INR 1.12 06/24/2011   HGBA1C 5.3 06/25/2011      Assessment / Plan:     Patient is a 71 year old former smoker, on long-term enbral for rheumatoid arthritis She's had a recent stroke involving the left arm and leg, and incidentally found to have a left upper lobe lung nodule that has increased in size since 2010 and a new lung nodule in the superior segment of the left lower lobe that is new since 2010. It is unclear if these are related to her rheumatoid disease or development of the underlying malignancy. She is just recovering from a stroke.   I discussed the new findings of this CT scan and recent PET scan with the patient and her family with the patient and her family. Her pulmonary function studies are also  Reviewed. Several options were discussed with the patient and her family, with a relatively frail condition poor pulmonary status and recent stroke proceeding immediately with surgical resection does not seem appropriate. I discussed with them needle biopsy now or followup CT scan in 3 months and then consider needle biopsy. There are aware that currently we have no definite tissue diagnosis of multiple lesions that could be rheumatoid related or malignancy. The patient prefers to wait 3 months for followup scan and then readdress possible needle biopsy.     Delight Ovens MD  Beeper [phone] Office [phone] 07/15/2011 5:54 PM

## 2011-07-30 ENCOUNTER — Ambulatory Visit (INDEPENDENT_AMBULATORY_CARE_PROVIDER_SITE_OTHER): Payer: MEDICARE | Admitting: Internal Medicine

## 2011-07-30 ENCOUNTER — Encounter: Payer: Self-pay | Admitting: Internal Medicine

## 2011-07-30 VITALS — BP 130/70 | HR 72 | Temp 98.2°F | Resp 16 | Ht 59.0 in | Wt 127.0 lb

## 2011-07-30 DIAGNOSIS — N318 Other neuromuscular dysfunction of bladder: Secondary | ICD-10-CM

## 2011-07-30 DIAGNOSIS — N3281 Overactive bladder: Secondary | ICD-10-CM

## 2011-07-30 DIAGNOSIS — M2550 Pain in unspecified joint: Secondary | ICD-10-CM

## 2011-07-30 DIAGNOSIS — M1710 Unilateral primary osteoarthritis, unspecified knee: Secondary | ICD-10-CM

## 2011-07-30 DIAGNOSIS — M069 Rheumatoid arthritis, unspecified: Secondary | ICD-10-CM

## 2011-07-30 DIAGNOSIS — M170 Bilateral primary osteoarthritis of knee: Secondary | ICD-10-CM

## 2011-07-30 MED ORDER — MIRABEGRON ER 25 MG PO TB24: 25.0000 mg | ORAL_TABLET | Freq: Every day | ORAL | Status: DC

## 2011-07-30 MED ORDER — CELECOXIB 200 MG PO CAPS: 200.0000 mg | ORAL_CAPSULE | Freq: Two times a day (BID) | ORAL | Status: AC

## 2011-07-30 NOTE — Patient Instructions (Signed)
The new medicine MYRBETRIQ Replaces the Detrol and to take it one pill a day  Take the Celebrex for the arthritic pain One-A-Day

## 2011-07-30 NOTE — Progress Notes (Signed)
Subjective:    Patient ID: Kristen Lee, female    DOB: [birth year], 71 y.o.   MRN: [id number]  HPI  Patient presents for followup after having visited the cardiothoracic surgeon for followup of the nodules she has been released for 3 months at which time we will reimage him.  Patient's blood pressure stable the patient has severe rheumatoid arthritis with pain in both legs.  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  HENT: Positive for congestion.   Eyes: Negative.   Respiratory: Negative.   Cardiovascular: Negative.   Gastrointestinal: Negative.   Genitourinary: Positive for urgency and frequency.  Musculoskeletal: Positive for joint swelling and gait problem.  Neurological: Positive for weakness.  Psychiatric/Behavioral: Positive for dysphoric mood.   Past Medical History  Diagnosis Date  . Thyroid disease   . Lupus   . Arthritis     rheumatoid and osteoarthritis  . Chicken pox as a child  . Shingles   . Measles as a child  . Mumps as a child  . History of shingles 11/25/2010  . Lung mass     History   Social History  . Marital Status: Married    Spouse Name: N/A    Number of Children: N/A  . Years of Education: N/A   Occupational History  . Not on file.   Social History Main Topics  . Smoking status: Former Smoker -- 2.0 packs/day for 15 years    Types: Cigarettes    Quit date: 02/03/1990  . Smokeless tobacco: Never Used  . Alcohol Use: No  . Drug Use: No  . Sexually Active: No   Other Topics Concern  . Not on file   Social History Narrative  . No narrative on file    Past Surgical History  Procedure Date  . Abdominal hysterectomy     partial still has ovaries  . Tonsillectomy and adenoidectomy as a child  .  Removal of blockage 02-03-10    Family History  Problem Relation Age of Onset  . Heart disease Mother   . Hyperlipidemia Mother   . Hypertension Mother   . Emphysema Father     smoked  . Heart attack Father     massive  . Heart disease Father     MI  . Other Son     shingles  . Cancer Paternal Grandmother     Allergies  Allergen Reactions  . Penicillins Swelling    Current Outpatient Prescriptions on File Prior to Visit  Medication Sig Dispense Refill  . aspirin 325 MG tablet Take 1 tablet (325 mg total) by mouth daily.  30 tablet  3  . calcium carbonate (OS-CAL) 600 MG TABS Take 600 mg by mouth daily.        . ferrous fumarate-b12-vitamic C-folic acid (TRINSICON / FOLTRIN) capsule Take 1 capsule by mouth daily before breakfast.      . Fluticasone-Salmeterol (ADVAIR) 100-50 MCG/DOSE AEPB Inhale 1 puff into the lungs every 12 (twelve) hours.      . folic acid (FOLVITE) 400 MCG tablet Take 400 mcg by mouth daily.        Marland Kitchen leflunomide (ARAVA) 20 MG tablet Take 20 mg by mouth daily.      Marland Kitchen levothyroxine (SYNTHROID, LEVOTHROID) 75 MCG tablet Take 1  tablet (75 mcg total) by mouth daily.  30 tablet  2  . olmesartan (BENICAR) 20 MG tablet Take 1 tablet (20 mg total) by mouth daily.      . simvastatin (ZOCOR) 20 MG tablet Take 1 tablet (20 mg total) by mouth every evening.  30 tablet  2  . traMADol (ULTRAM) 50 MG tablet Take 50 mg by mouth every 6 (six) hours as needed. For pain      . venlafaxine XR (EFFEXOR-XR) 75 MG 24 hr capsule Take 1 capsule (75 mg total) by mouth daily.  90 capsule  3  . fesoterodine (TOVIAZ) 8 MG TB24 Take 1 tablet (8 mg total) by mouth daily.  30 tablet  2  . mirabegron ER (MYRBETRIQ) 25 MG TB24 Take 1 tablet (25 mg total) by mouth daily.  30 tablet    . telmisartan (MICARDIS) 40 MG tablet Take 1 tablet (40 mg total) by mouth daily.        BP 130/70  Pulse 72  Temp 98.2 F (36.8 C)  Resp 16  Ht 4\' 11"  (1.499 m)  Wt 127 lb (57.607 kg)  BMI 25.65  kg/m2        Objective:   Physical Exam  Nursing note and vitals reviewed. Constitutional: She appears well-developed and well-nourished.  HENT:  Head: Normocephalic and atraumatic.  Eyes: Conjunctivae are normal. Pupils are equal, round, and reactive to light.  Neck: Neck supple. No thyromegaly present.  Cardiovascular: Regular rhythm.   Pulmonary/Chest: No stridor. No respiratory distress. She has no wheezes.  Abdominal: She exhibits no distension. There is no tenderness.  Musculoskeletal: She exhibits no tenderness.  Neurological: Coordination abnormal.  Psychiatric: She has a normal mood and affect. Her behavior is normal.          Assessment & Plan:  She is status post CVA we'll make sure that she is stable on a new blood pressure medication we give her samples of Benicar 20 mg by mouth daily.  She has increased bladder issues with incontinence of mixed etiology she has been on detrol the past but states he still has significant nocturia we will put her on myrbetrq  Followup office in 4 weeks Stable breathing Monitor labs on rheumatoid drug next visit

## 2011-09-01 ENCOUNTER — Ambulatory Visit (INDEPENDENT_AMBULATORY_CARE_PROVIDER_SITE_OTHER): Payer: MEDICARE | Admitting: Internal Medicine

## 2011-09-01 ENCOUNTER — Encounter: Payer: Self-pay | Admitting: Internal Medicine

## 2011-09-01 VITALS — BP 150/80 | HR 76 | Temp 98.7°F | Resp 18 | Ht 59.0 in | Wt 124.0 lb

## 2011-09-01 DIAGNOSIS — N393 Stress incontinence (female) (male): Secondary | ICD-10-CM

## 2011-09-01 DIAGNOSIS — T887XXA Unspecified adverse effect of drug or medicament, initial encounter: Secondary | ICD-10-CM

## 2011-09-01 DIAGNOSIS — M069 Rheumatoid arthritis, unspecified: Secondary | ICD-10-CM

## 2011-09-01 DIAGNOSIS — I10 Essential (primary) hypertension: Secondary | ICD-10-CM

## 2011-09-01 DIAGNOSIS — N3289 Other specified disorders of bladder: Secondary | ICD-10-CM

## 2011-09-01 MED ORDER — FESOTERODINE FUMARATE ER 8 MG PO TB24: 8.0000 mg | ORAL_TABLET | Freq: Every day | ORAL | Status: DC

## 2011-09-01 MED ORDER — TELMISARTAN 40 MG PO TABS: 40.0000 mg | ORAL_TABLET | Freq: Every day | ORAL | Status: DC

## 2011-09-01 NOTE — Patient Instructions (Signed)
For the bladder control we are going to try Toviaz 8 mg once a day in the afternoons for your blood pressure control we are going to replace the Benicar with micardis 40 mg one a day

## 2011-09-01 NOTE — Progress Notes (Signed)
Subjective:    Patient ID: Kristen Lee, female    DOB: August 22, 1940, 71 y.o.   MRN: [id number]  HPI Patient was given samples of a long-acting medicine to replace date 12 and she had an incomplete response to this medication still with urinary frequency.  We discussed other medications which might be more effective than the day call or this long-acting medications  We will giver her a trail of Toviaz  For blood pressure we will use micardis  She has had frequent falls do to her imbalance.  The imbalance is complicated by her history of CVA but also by her severe rheumatoid arthritis.  She is reached point where she has had multiple falls and is at risk for more falls I think electric scooter may be the best that to keep her independent and able to shop for groceries in safety   Review of Systems  Constitutional: Negative for activity change, appetite change and fatigue.  HENT: Negative for ear pain, congestion, neck pain, postnasal drip and sinus pressure.   Eyes: Negative for redness and visual disturbance.  Respiratory: Negative for cough, shortness of breath and wheezing.   Gastrointestinal: Negative for abdominal pain and abdominal distention.  Genitourinary: Negative for dysuria, frequency and menstrual problem.  Musculoskeletal: Positive for myalgias, joint swelling, arthralgias and gait problem.  Skin: Negative for rash and wound.  Neurological: Positive for weakness and light-headedness. Negative for dizziness and headaches.  Hematological: Negative for adenopathy. Does not bruise/bleed easily.  Psychiatric/Behavioral: Positive for dysphoric mood. Negative for disturbed wake/sleep cycle and decreased concentration.   Past Medical History  Diagnosis Date  . Thyroid disease   . Lupus   . Arthritis     rheumatoid and osteoarthritis  . Chicken pox as a child  . Shingles   . Measles as a child  . Mumps as a child  . History of shingles 11/25/2010  . Lung mass      History   Social History  . Marital Status: Married    Spouse Name: N/A    Number of Children: N/A  . Years of Education: N/A   Occupational History  . Not on file.   Social History Main Topics  . Smoking status: Former Smoker -- 2.0 packs/day for 15 years    Types: Cigarettes    Quit date: 02/03/1990  . Smokeless tobacco: Never Used  . Alcohol Use: No  . Drug Use: No  . Sexually Active: No   Other Topics Concern  . Not on file   Social History Narrative  . No narrative on file    Past Surgical History  Procedure Date  . Abdominal hysterectomy     partial still has ovaries  . Tonsillectomy and adenoidectomy as a child  . Removal of blockage 02-03-10    Family History  Problem Relation Age of Onset  . Heart disease Mother   . Hyperlipidemia Mother   . Hypertension Mother   . Emphysema Father     smoked  . Heart attack Father     massive  . Heart disease Father     MI  . Other Son     shingles  . Cancer Paternal Grandmother     Allergies  Allergen Reactions  . Penicillins Swelling    Current Outpatient Prescriptions on File Prior to Visit  Medication Sig Dispense Refill  . aspirin 325 MG tablet Take 1 tablet (325 mg total) by mouth daily.  30 tablet  3  . calcium carbonate (  OS-CAL) 600 MG TABS Take 600 mg by mouth daily.        . ferrous fumarate-b12-vitamic C-folic acid (TRINSICON / FOLTRIN) capsule Take 1 capsule by mouth daily before breakfast.      . Fluticasone-Salmeterol (ADVAIR) 100-50 MCG/DOSE AEPB Inhale 1 puff into the lungs every 12 (twelve) hours.      . folic acid (FOLVITE) 400 MCG tablet Take 400 mcg by mouth daily.        Marland Kitchen leflunomide (ARAVA) 20 MG tablet Take 20 mg by mouth daily.      Marland Kitchen levothyroxine (SYNTHROID, LEVOTHROID) 75 MCG tablet Take 1 tablet (75 mcg total) by mouth daily.  30 tablet  2  . mirabegron ER (MYRBETRIQ) 25 MG TB24 Take 1 tablet (25 mg total) by mouth daily.  30 tablet    . olmesartan (BENICAR) 20 MG tablet Take  1 tablet (20 mg total) by mouth daily.      . simvastatin (ZOCOR) 20 MG tablet Take 1 tablet (20 mg total) by mouth every evening.  30 tablet  2  . traMADol (ULTRAM) 50 MG tablet Take 50 mg by mouth every 6 (six) hours as needed. For pain      . venlafaxine XR (EFFEXOR-XR) 75 MG 24 hr capsule Take 1 capsule (75 mg total) by mouth daily.  90 capsule  3  . fesoterodine (TOVIAZ) 8 MG TB24 Take 1 tablet (8 mg total) by mouth daily.  30 tablet  2    BP 150/80  Pulse 76  Temp 98.7 F (37.1 C)  Resp 18  Ht 4\' 11"  (1.499 m)  Wt 124 lb (56.246 kg)  BMI 25.04 kg/m2       Objective:   Physical Exam  Nursing note and vitals reviewed. Constitutional: She appears well-developed and well-nourished. No distress.  HENT:  Head: Normocephalic and atraumatic.  Right Ear: External ear normal.  Left Ear: External ear normal.  Nose: Nose normal.  Mouth/Throat: Oropharynx is clear and moist.  Eyes: Conjunctivae and EOM are normal. Pupils are equal, round, and reactive to light.  Neck: Normal range of motion. Neck supple. No JVD present. No tracheal deviation present. No thyromegaly present.  Cardiovascular: Normal rate, regular rhythm, normal heart sounds and intact distal pulses.   No murmur heard. Pulmonary/Chest: Effort normal and breath sounds normal. She has no wheezes. She exhibits no tenderness.  Abdominal: Soft. Bowel sounds are normal.  Musculoskeletal: She exhibits edema and tenderness.  Lymphadenopathy:    She has no cervical adenopathy.  Neurological: She is alert. No cranial nerve deficit. Coordination abnormal.       Imbalance and gait disorder noted  Skin: Skin is warm and dry. She is not diaphoretic.  Psychiatric: She has a normal mood and affect. Her behavior is normal.          Assessment & Plan:  Face-to-face evaluation for electric scooter significant risk of falls and complications from falls is present multifactorial imbalance is due to rheumatoid arthritis progressive  deconditioning and history of CVA.  Patient has bladder incontinence and has tried multiple medications we'll try a trial to be a for 8 weeks and followup at the end of 8 weeks.  Patient has a history of CVA with blood pressure that is not fully controlled we will give her my card is 40 mg by mouth daily as a trial she'll monitor her blood pressure return to office in 8 weeks

## 2011-09-02 ENCOUNTER — Telehealth: Payer: Self-pay | Admitting: Internal Medicine

## 2011-09-02 LAB — CBC WITH DIFFERENTIAL/PLATELET
Basophils Absolute: 0 10*3/uL (ref 0.0–0.1)
HCT: 36.7 % (ref 36.0–46.0)
Hemoglobin: 12.1 g/dL (ref 12.0–15.0)
Lymphs Abs: 0.9 10*3/uL (ref 0.7–4.0)
MCHC: 33 g/dL (ref 30.0–36.0)
MCV: 103.5 fl — ABNORMAL HIGH (ref 78.0–100.0)
Monocytes Absolute: 0.4 10*3/uL (ref 0.1–1.0)
Neutro Abs: 3 10*3/uL (ref 1.4–7.7)
Platelets: 152 10*3/uL (ref 150.0–400.0)
RDW: 13.8 % (ref 11.5–14.6)

## 2011-09-02 LAB — BASIC METABOLIC PANEL
CO2: 28 mEq/L (ref 19–32)
Calcium: 8.9 mg/dL (ref 8.4–10.5)
Chloride: 98 mEq/L (ref 96–112)
Glucose, Bld: 80 mg/dL (ref 70–99)
Sodium: 136 mEq/L (ref 135–145)

## 2011-09-02 LAB — HEPATIC FUNCTION PANEL
AST: 27 U/L (ref 0–37)
Albumin: 3.7 g/dL (ref 3.5–5.2)
Total Bilirubin: 0.3 mg/dL (ref 0.3–1.2)

## 2011-09-02 NOTE — Telephone Encounter (Signed)
Pt requesting to have Labs faxed to Dr. Vanessa Kick Fax: [phone]

## 2011-09-02 NOTE — Telephone Encounter (Signed)
Will do!

## 2011-09-08 ENCOUNTER — Telehealth: Payer: Self-pay | Admitting: Internal Medicine

## 2011-09-08 DIAGNOSIS — N3289 Other specified disorders of bladder: Secondary | ICD-10-CM

## 2011-09-08 DIAGNOSIS — N393 Stress incontinence (female) (male): Secondary | ICD-10-CM

## 2011-09-08 MED ORDER — TELMISARTAN 40 MG PO TABS: 40.0000 mg | ORAL_TABLET | Freq: Every day | ORAL | Status: DC

## 2011-09-08 MED ORDER — FESOTERODINE FUMARATE ER 8 MG PO TB24: 8.0000 mg | ORAL_TABLET | Freq: Every day | ORAL | Status: DC

## 2011-09-08 NOTE — Telephone Encounter (Signed)
Pt was on samples of both Toviaz 8mg  and Micardis 40mg  and states that it is helping and is requesting a refill to be sent into [facility]

## 2011-09-17 ENCOUNTER — Other Ambulatory Visit: Payer: Self-pay | Admitting: Cardiothoracic Surgery

## 2011-09-17 DIAGNOSIS — D381 Neoplasm of uncertain behavior of trachea, bronchus and lung: Secondary | ICD-10-CM

## 2011-09-23 ENCOUNTER — Telehealth: Payer: Self-pay | Admitting: Internal Medicine

## 2011-09-23 MED ORDER — FE FUMARATE-B12-VIT C-FA-IFC PO CAPS: 1.0000 | ORAL_CAPSULE | Freq: Every day | ORAL | Status: DC

## 2011-09-23 NOTE — Telephone Encounter (Signed)
done

## 2011-09-23 NOTE — Telephone Encounter (Signed)
Pt requesting a refill on Ferocon capsules   [facility]

## 2011-10-01 ENCOUNTER — Ambulatory Visit (INDEPENDENT_AMBULATORY_CARE_PROVIDER_SITE_OTHER): Payer: MEDICARE | Admitting: Internal Medicine

## 2011-10-01 VITALS — BP 140/80 | HR 76 | Temp 98.2°F | Resp 16 | Ht 59.0 in | Wt 124.0 lb

## 2011-10-01 DIAGNOSIS — L0390 Cellulitis, unspecified: Secondary | ICD-10-CM

## 2011-10-01 DIAGNOSIS — R21 Rash and other nonspecific skin eruption: Secondary | ICD-10-CM

## 2011-10-01 DIAGNOSIS — L0291 Cutaneous abscess, unspecified: Secondary | ICD-10-CM

## 2011-10-01 DIAGNOSIS — T887XXA Unspecified adverse effect of drug or medicament, initial encounter: Secondary | ICD-10-CM

## 2011-10-01 LAB — CBC WITH DIFFERENTIAL/PLATELET
Basophils Relative: 0.5 % (ref 0.0–3.0)
Eosinophils Relative: 2.4 % (ref 0.0–5.0)
HCT: 35.7 % — ABNORMAL LOW (ref 36.0–46.0)
MCV: 103.5 fl — ABNORMAL HIGH (ref 78.0–100.0)
Monocytes Absolute: 0.5 10*3/uL (ref 0.1–1.0)
Monocytes Relative: 10.8 % (ref 3.0–12.0)
Neutrophils Relative %: 56 % (ref 43.0–77.0)
Platelets: 144 10*3/uL — ABNORMAL LOW (ref 150.0–400.0)
RBC: 3.46 Mil/uL — ABNORMAL LOW (ref 3.87–5.11)
WBC: 4.4 10*3/uL — ABNORMAL LOW (ref 4.5–10.5)

## 2011-10-01 LAB — HEPATIC FUNCTION PANEL
ALT: 16 U/L (ref 0–35)
AST: 25 U/L (ref 0–37)
Albumin: 3.6 g/dL (ref 3.5–5.2)
Total Bilirubin: 0.6 mg/dL (ref 0.3–1.2)

## 2011-10-01 MED ORDER — SULFAMETHOXAZOLE-TRIMETHOPRIM 800-160 MG PO TABS: 1.0000 | ORAL_TABLET | Freq: Two times a day (BID) | ORAL | Status: DC

## 2011-10-01 MED ORDER — METHYLPREDNISOLONE (PAK) 4 MG PO TABS: 4.0000 mg | ORAL_TABLET | Freq: Every day | ORAL | Status: DC

## 2011-10-01 NOTE — Patient Instructions (Addendum)
His and has a ulceration of her first toe on her foot where her big toe is rubbing against the toe we will treat this with Silvadene cream Telfa pad and a buddy wrap system to prevent infection.  You will take the prednisone Dosepak and the Septra DS for the next week and report back to my office if the rash does not improve dramatically

## 2011-10-01 NOTE — Progress Notes (Signed)
  Subjective:    Patient ID: Kristen Lee, female    DOB: 04/30/1940, 71 y.o.   MRN: [id number]  HPI patient is a 71 year old female with rheumatoid arthritis who had a rash when she had an embryo injection and now has a persistent rash that is over her lower trunk both on her back buttocks and lower abdomen and on her right shoulder.  The rash which is very itchy and is covered with excoriations where she scratched it.  She has been using 2 different types of steroid cream without success. She also has an area of ulceration on her left first toe    Review of Systems  Constitutional: Negative for activity change, appetite change and fatigue.  HENT: Negative for ear pain, congestion, neck pain, postnasal drip and sinus pressure.   Eyes: Negative for redness and visual disturbance.  Respiratory: Negative for cough, shortness of breath and wheezing.   Gastrointestinal: Negative for abdominal pain and abdominal distention.  Genitourinary: Negative for dysuria, frequency and menstrual problem.  Musculoskeletal: Positive for myalgias, back pain, joint swelling, arthralgias and gait problem.  Skin: Positive for rash. Negative for wound.  Neurological: Negative for dizziness, weakness and headaches.  Hematological: Negative for adenopathy. Does not bruise/bleed easily.  Psychiatric/Behavioral: Negative for disturbed wake/sleep cycle and decreased concentration.       Objective:   Physical Exam  Nursing note and vitals reviewed. Constitutional: She is oriented to person, place, and time. She appears well-developed and well-nourished. No distress.  HENT:  Head: Normocephalic and atraumatic.  Right Ear: External ear normal.  Left Ear: External ear normal.  Nose: Nose normal.  Mouth/Throat: Oropharynx is clear and moist.  Eyes: Conjunctivae and EOM are normal. Pupils are equal, round, and reactive to light.  Neck: Normal range of motion. Neck supple. No JVD present. No tracheal  deviation present. No thyromegaly present.  Cardiovascular: Normal rate, regular rhythm, normal heart sounds and intact distal pulses.   No murmur heard. Pulmonary/Chest: Effort normal and breath sounds normal. She has no wheezes. She exhibits no tenderness.  Abdominal: Soft. Bowel sounds are normal.  Musculoskeletal: She exhibits edema and tenderness.  Lymphadenopathy:    She has no cervical adenopathy.  Neurological: She is alert and oriented to person, place, and time. She has normal reflexes. No cranial nerve deficit.  Skin: Skin is warm and dry. Rash noted. She is not diaphoretic. There is erythema.  Psychiatric: She has a normal mood and affect. Her behavior is normal.          Assessment & Plan:  We will treat the rash with a combination of Septra DS for 10 days and a Medrol Dosepak.  We will treat cellulitis of the toe with Silvadene cream and Telfa pads with a buddy wrap

## 2011-10-02 ENCOUNTER — Other Ambulatory Visit: Payer: Self-pay | Admitting: Internal Medicine

## 2011-10-02 DIAGNOSIS — L0390 Cellulitis, unspecified: Secondary | ICD-10-CM

## 2011-10-02 DIAGNOSIS — R21 Rash and other nonspecific skin eruption: Secondary | ICD-10-CM

## 2011-10-02 NOTE — Telephone Encounter (Signed)
Pt called and said that she checked with [facility] re: methylPREDNIsolone (MEDROL DOSPACK) 4 MG tablet and sulfamethoxazole-trimethoprim (BACTRIM DS,SEPTRA DS) 800-160 MG per tablet and nothing had been sent in yet. Pls call in to [facility] NOT [facility].

## 2011-10-03 MED ORDER — SULFAMETHOXAZOLE-TRIMETHOPRIM 800-160 MG PO TABS: 1.0000 | ORAL_TABLET | Freq: Two times a day (BID) | ORAL | Status: AC

## 2011-10-03 MED ORDER — METHYLPREDNISOLONE (PAK) 4 MG PO TABS: 4.0000 mg | ORAL_TABLET | Freq: Every day | ORAL | Status: DC

## 2011-10-03 NOTE — Telephone Encounter (Signed)
Rx sent to [facility].

## 2011-10-03 NOTE — Telephone Encounter (Signed)
Pt called pharmacy to see in refills were there. Pharmacy contacted [place] and is requesting to have the script faxed to them at [phone]  [facility]

## 2011-10-23 ENCOUNTER — Other Ambulatory Visit: Payer: MEDICARE

## 2011-10-23 ENCOUNTER — Ambulatory Visit: Payer: MEDICARE | Admitting: Cardiothoracic Surgery

## 2011-11-06 ENCOUNTER — Ambulatory Visit (INDEPENDENT_AMBULATORY_CARE_PROVIDER_SITE_OTHER): Payer: MEDICARE | Admitting: Cardiothoracic Surgery

## 2011-11-06 ENCOUNTER — Ambulatory Visit
Admission: RE | Admit: 2011-11-06 | Discharge: 2011-11-06 | Disposition: A | Discharge status: Home / Self Care | Payer: MEDICARE | Source: Ambulatory Visit | Attending: Cardiothoracic Surgery | Admitting: Cardiothoracic Surgery

## 2011-11-06 ENCOUNTER — Encounter: Payer: Self-pay | Admitting: Cardiothoracic Surgery

## 2011-11-06 ENCOUNTER — Ambulatory Visit: Payer: MEDICARE | Admitting: Cardiothoracic Surgery

## 2011-11-06 VITALS — BP 114/78 | HR 107 | Resp 16 | Ht 59.0 in | Wt 123.0 lb

## 2011-11-06 DIAGNOSIS — Z7189 Other specified counseling: Secondary | ICD-10-CM

## 2011-11-06 DIAGNOSIS — D381 Neoplasm of uncertain behavior of trachea, bronchus and lung: Secondary | ICD-10-CM

## 2011-11-06 DIAGNOSIS — Z712 Person consulting for explanation of examination or test findings: Secondary | ICD-10-CM

## 2011-11-06 DIAGNOSIS — R911 Solitary pulmonary nodule: Secondary | ICD-10-CM

## 2011-11-06 DIAGNOSIS — J984 Other disorders of lung: Secondary | ICD-10-CM

## 2011-11-06 NOTE — Patient Instructions (Addendum)
Return 4 months for repeat scan   Pulmonary Nodule A pulmonary (lung) nodule is small, round growth in the lung. The size of a pulmonary nodule can be as small as a pencil eraser (1/5 inch or 4 millmeters) to a little bigger than your biggest toenail (1 inch or 25 millimeters). A pulmonary nodule is usually an unplanned finding. It may be found on a chest X-ray or a computed tomography (CT) scan when you have imaging tests of your lungs done. When a pulmonary nodule is found, tests will be done to determine if the nodule is benign (not cancerous) or malignant (cancerous). Follow-up treatment or testing is based on the size of the pulmonary nodule and your risk of getting lung cancer.  CAUSES Causes of pulmonary nodules can vary.  Benign pulmonary nodules  can be caused from different things. Some of these things include:  Infection. This can be a common cause of a benign pulmonary nodule. The infection may be active (a current infection) or an old infection that is no longer active. Three types of infections can cause a pulmonary nodule. These are:  Bacterial Infection.  Fungal infection.  Viral Infections.  Hematoma. This is a bruise in the lung. A hematoma can happen from an injury to your chest.  Some common diseases can lead to benign pulmonary nodules. For example, rheumatoid arthritis can be a cause of a pulmonary nodule.  Other unusual things can cause a benign pulmonary nodule. These can include:  Having had tuberculosis.  Rare diseases, such as a lung cyst. Malignant pulmonary nodules.  These are cancerous growths. The cancer may have:  Started in the lung. Some lung cancers first detected as a pulmonary nodule.  Spread to the lung from cancer somewhere else in the body. This is called metastatic cancer.  Certain risk factors make a cancerous pulmonary nodule more likely. They include:  Age. As people get older, a pulmonary nodule is more likely to be cancerous.  Cancer  history. If one of your immediate family members has had cancer, you have a higher risk of developing cancer.  Smoking. This includes people who currently smoke and those who have quit. DIAGNOSIS To diagnose whether a pulmonary nodule is benign or malignant, a variety of tests will be done. This includes things such as:  Health history. Questions regarding your current health, past health, and family health will be asked.  Blood tests. Results of blood work can show:  Tumor markers for cancer.  Any type of infection.  A skin test called a tuberculin (TB) test may be done. This test can tell if you have been exposed to the germ that causes tuberculosis.  Imaging tests. These take pictures of your lungs. Types of imaging tests include:  Chest X-ray. This can help in several ways. An X-ray gives a close-up look at the pulmonary nodule. A new X-ray can be compared with any X-rays you have had in the past.   Computed tomography  (CT) scan. This test shows smaller pulmonary nodules more clearly than an X-ray.  Positron emission tomography  (PET) scan. This is a test that uses a radioactive substance to identify a pulmonary nodule. A safe amount of radioactive substance is injected into the blood stream. Then, the scan takes a picture of the pulmonary nodule. A malignant pulmonary nodule will absorb the substance faster than a benign pulmonary nodule. The radioactive substance is eliminated from your body in your urine.  Biopsy.  This removes a tiny piece of  the pulmonary nodule so it can be checked under a microscope. Medicine will be given to help keep you relaxed and pain free when a biopsy is done. Types of biopsies include:  Bronchoscopy . This is a surgical procedure. It can be used for pulmonary nodules that are close to the airways in the lung. It uses a scope (a thin tube) with a tiny camera and light on the end. The scope is put in the windpipe. Your caregiver can then see inside the  lung. A tiny tool put through the scope is used to take a small sample of the pulmonary nodule tissue.  Transthoracic needle aspiration . This method is used if the pulmonary nodule is far away from the air passages in the lung. A long, thin needle is put through the chest into the lung nodule. A CT scan is done at the same time which can make it easier to locate the pulmonary nodule.  Surgical lung biopsy . This is a surgical procedure in which the pulmonary nodule is removed. This is usually recommended when the pulmonary nodule is most likely malignant or a biopsy cannot be obtained by either bronchoscopy or transthoracic needle aspiration. PULMONARY NODULE FOLLOW-UP RECOMMENDATIONS The frequency of pulmonary nodule follow-up is based on your risk factors and size of the pulmonary nodule. If your caregiver suspects the pulmonary nodule is cancerous or the pulmonary nodule changes during any of the follow-up CT scans, additional testing or biopsies will be done.   If you have no or low risk of getting lung cancer (non-smoker, no personal cancer history), recommended follow-up is based on the following pulmonary nodule size:  A pulmonary nodule that is < 4 mm does not require any follow-up.  A pulmonary nodule that is 4 to 6 mm should be re-imaged by CT scan in 12 months.  A pulmonary nodule that is 6 to 8 mm should be re-imaged by CT scan at 6 to 12 months and then again at 18 to 24 months if no change in size.  A pulmonary nodule > 8 mm in size should be followed closely and re-imaged by CT scan at 3, 9, and 24 months.   If you are at risk of getting lung cancer (current or former smoker, family history of cancer), recommended follow-up is based on the following pulmonary nodule size:  A pulmonary nodule that is < 4 mm in size should be re-imaged by CT scan in 12 months.  A pulmonary nodule that is 4 to 6 mm in size should be re-imaged by CT scan at 6 to 12 months and again at 18 to 24  months.  A pulmonary nodule that is 6 to 8 mm in size should be re-imaged by CT scan at 3, 9, and 24 months.  A pulmonary nodule > 8 mm in size should be followed closely and re-imaged by CT scan at 3, 9, and 24 months. SEEK MEDICAL CARE IF: While waiting for test results to determine what type of pulmonary nodule you have, be sure to contact your caregiver if you:  Have trouble breathing when you are active.  Feel sick or unusually tired.  Do not feel like eating.  Lose weight without trying to.  Develop chills or night sweats.  Mild or moderate fevers generally have no long-term effects and often do not require treatment. There are a few exceptions (see below). SEEK IMMEDIATE MEDICAL CARE IF:  You cannot catch your breath or you begin wheezing.  You cannot  stop coughing.  You cough up blood.  You feel like you are going to pass out or become dizzy.  You have sudden chest pain.  You have a fever or persistent symptoms for more than 72 hours.  You have a fever and your symptoms suddenly get worse. MAKE SURE YOU   Understand these instructions.  Will watch your condition.  Will get help right away if you are not doing well or get worse. Document Released: 11/17/2008 Document Revised: 04/14/2011 Document Reviewed: 11/17/2008 [facility] Patient Information 2013 [place], [place].

## 2011-11-06 NOTE — Progress Notes (Signed)
[address]            [place] [address]          [phone]      Kristen Lee [facility] Medical Record #[id number] Date of Birth: 11/17/1940  Referring: Eddie North, MD Primary Care: Carrie Mew, MD  Chief Complaint:    Chief Complaint  Patient presents with  . Lung Lesion    3 month f/u with CT CHEST    History of Present Illness:    Patient is a 71 year old female who 3 days ago had an episode of dizziness and left arm and leg numbness and was evaluated by the stroke team at [place]. She was kept in the hospital approximately 24 hours during that time. A chest x-ray and CT scan of the chest was performed raising the issue of a new left lung nodule. The patient has a history of rheumatoid arthritis and has been on Enbrel for approximately 10 years she is not currently taking. She is a former smoker  but quit smoking 27 years ago.  The patient has had difficulty ambulating and is currently using a walker. She has known underlying COPD and occasionally wheezes. Patient notes that she had increased wheezing this morning used her inhalers  Current Activity/ Functional Status: Patient is not independent with mobility/ambulation, transfers, ADL's, IADL's.   Past Medical History  Diagnosis Date  . Thyroid disease   . Lupus   . Arthritis     rheumatoid and osteoarthritis  . Chicken pox as a child  . Shingles   . Measles as a child  . Mumps as a child  . History of shingles 11/25/2010  . Lung mass     Past Surgical History  Procedure Date  . Abdominal hysterectomy     partial still has ovaries  . Tonsillectomy and adenoidectomy as a child  . Removal of blockage exploratory lab lysis of adhesions 02-03-10    Family History  Problem Relation Age of Onset  . Heart disease Mother   . Hyperlipidemia Mother   . Hypertension Mother   . Emphysema Father     smoked  . Heart attack Father     massive  . Heart  disease Father     MI  . Other Son     shingles  . Cancer Paternal Grandmother     History   Social History  . Marital Status: Married    Spouse Name: N/A    Number of Children: N/A  . Years of Education: N/A   Occupational History  . Worked at [employer]   Social History Main Topics  . Smoking status: Former Smoker -- 2.0 packs/day for 15 years    Types: Cigarettes    Quit date: 02/03/1990  . Smokeless tobacco: Never Used  . Alcohol Use: No  . Drug Use: No  . Sexually Active: No      History  Smoking status  . Former Smoker -- 2.0 packs/day for 15 years  . Types: Cigarettes  . Quit date: 02/03/1990  Smokeless tobacco  . Never Used    History  Alcohol Use No     Allergies  Allergen Reactions  . Penicillins Swelling    Current Outpatient Prescriptions  Medication Sig Dispense Refill  . aspirin 325 MG tablet Take 1 tablet (325 mg total) by mouth daily.  30 tablet  3  . calcium carbonate (OS-CAL) 600 MG TABS Take 600 mg by mouth daily.        . ferrous fumarate-b12-vitamic C-folic acid (TRINSICON / FOLTRIN) capsule Take 1 capsule by mouth daily before breakfast.  90 capsule  3  . fesoterodine (TOVIAZ) 8 MG TB24 Take 1 tablet (8 mg total) by mouth daily.  90 tablet  1  . Fluticasone-Salmeterol (ADVAIR) 100-50 MCG/DOSE AEPB Inhale 1 puff into the lungs every 12 (twelve) hours.      . folic acid (FOLVITE) 400 MCG tablet Take 400 mcg by mouth daily.        Marland Kitchen leflunomide (ARAVA) 20 MG tablet Take 20 mg by mouth daily.      Marland Kitchen levothyroxine (SYNTHROID, LEVOTHROID) 75 MCG tablet Take 1 tablet (75 mcg total) by mouth daily.  30 tablet  2  . methylPREDNIsolone (MEDROL DOSPACK) 4 MG tablet Take 1 tablet (4 mg total) by mouth daily. follow package directions  21 tablet  0  . mirabegron ER (MYRBETRIQ) 25 MG TB24 Take 1 tablet (25 mg total) by mouth daily.  30 tablet    . olmesartan (BENICAR) 20 MG tablet Take 1 tablet (20 mg total) by mouth daily.      . simvastatin  (ZOCOR) 20 MG tablet Take 1 tablet (20 mg total) by mouth every evening.  30 tablet  2  . telmisartan (MICARDIS) 40 MG tablet Take 1 tablet (40 mg total) by mouth daily.  90 tablet  1  . traMADol (ULTRAM) 50 MG tablet Take 50 mg by mouth every 6 (six) hours as needed. For pain      . venlafaxine XR (EFFEXOR-XR) 75 MG 24 hr capsule Take 1 capsule (75 mg total) by mouth daily.  90 capsule  3       Review of Systems:     Cardiac Review of Systems: Y or N  Chest Pain [  n  ]  Resting SOB [   ] Exertional SOB  [ y ]  Orthopnea Cove.Etienne  ]   Pedal Edema [ y  ]    Palpitations Milo.Brash  ] Syncope  [ n ]   Presyncope [ y ]  General Review of Systems: [Y] = yes [  ]=no Constitional: recent weight change [ y ]; anorexia [  ]; fatigue [  ]; nausea [  ]; night sweats [  ]; fever [  ]; or chills [  ];                                                                                                                                          Dental: poor dentition[ n ]; Last Dentist visit:   Eye : blurred vision [ y ]; diplopia [   ]; vision changes [  ];  Amaurosis fugax[y  ]; Resp: cough [  ];  wheezing[yn  ];  hemoptysis[  ];  shortness of breath[n  ]; paroxysmal nocturnal dyspnea[ n ]; dyspnea on exertion[y  ]; or orthopnea[y  ];  GI:  gallstones[  ], vomiting[  ];  dysphagia[  ]; melena[  ];  hematochezia [  ]; heartburn[  ];   Hx of  Colonoscopy[  ]; GU: kidney stones [  ]; hematuria[  ];   dysuria [  ];  nocturia[  ];  history of     obstruction [  ];             Skin: rash, swelling[  ];, hair loss[  ];  peripheral edema[  ];  or itching[  ]; Musculosketetal: myalgias[y  ];  joint swelling[y  ];  joint erythema[ y ];  joint pain[ y ];  back pain[  y];  Heme/Lymph: bruising[  ];  bleeding[  ];  anemia[  ];  Neuro: TIA[ n ];  headaches[ h ];  stroke[recent  ];  vertigo[ y ];  seizures[n  ];   paresthesias[ n ];  difficulty walking[ y ];  Psych:depression[y  ]; anxiety[  ];  Endocrine: diabetes[  ];  thyroid  dysfunction[ y ];  Immunizations: Flu [  ]; Pneumococcal[  ];  Other:  Physical Exam: BP 114/78  Pulse 107  Resp 16  Ht 4\' 11"  (1.499 m)  Wt 123 lb (55.792 kg)  BMI 24.84 kg/m2  SpO2 97%  General appearance: alert, cooperative, appears older than stated age, cachectic, fatigued and no distress Neurologic: intact Heart: regular rate and rhythm, S1, S2 normal, no murmur, click, rub or gallop and normal apical impulse Lungs: clear to auscultation bilaterally and normal percussion bilaterally Abdomen: soft, non-tender; bowel sounds normal; no masses,  no organomegaly Extremities: varicose veins noted and venous stasis dermatitis noted She has numerous scarred areas on the lower extremities from previous slowly here healing ulcerations   Diagnostic Studies & Laboratory data:   PFTs are performed FEV1 is 0.98 fusion capacity of 44% moderately severe obstructive airway disease and severe diffusion defect    Recent Radiology Findings:  Ct Chest Wo Contrast  11/06/2011  *RADIOLOGY REPORT*  Clinical Data: Through both follow up pulmonary nodules  CT CHEST WITHOUT CONTRAST  Technique:  Multidetector CT imaging of the chest was performed following the standard protocol without IV contrast.  Comparison: PET CT 07/08/2011; chest CT 06/24/2011  Findings:  Mediastinum: Unremarkable thyroid gland and thoracic inlet.  No suspicious mediastinal or hilar adenopathy.  Unremarkable thoracic esophagus.  Heart/Vascular: Significantly limited evaluation the absence of intravenous contrast.  Unchanged atherosclerotic, tortuous and ectatic thoracic aorta with borderline aneurysmal dilatation at the aortic hiatus.  The aorta measures 3.2 cm in maximal diameter at the hiatus.  Lungs/Pleura: Unchanged chronic elevation of the right hemidiaphragm is similar to incrementally enlarged cavitary left upper lobe pulmonary nodule which measures 19 x 9 mm in greatest dimension compared to 19 x 7 mm on 06/24/2011.   Stable 7  mm left lower lobe pulmonary nodule. Stable 3 and 4 mm right upper, superior segment right lower and left lower half of the 10 lobe pulmonary nodules.  Additional scattered two - 3 mm calcified pulmonary nodules throughout the lungs, unchanged.  Mild biapical pleural parenchymal scarring.  Upper Abdomen: Relative hyperattenuation the hepatic parenchyma relative to the spleen.  Otherwise, unremarkable noncontrast CT appearance of the upper abdomen.  Bones: No acute fracture or aggressive appearing lytic or blastic osseous lesion.  IMPRESSION:  1.  Stable to incrementally enlarged cavitary left upper lobe pulmonary nodule  compared to 06/24/2011.  Stable hypermetabolic nodule in the superior segment of the left lower lobe.  Additional scattered calcified and noncalcified pulmonary nodules are also unchanged. 2.  Chronic elevation right hemidiaphragm 3.  Atherosclerosis with ectatic, bordering on aneurysmally dilated distal thoracic aorta. 4.  Dense hepatic parenchyma.  Differential considerations include chronic amiodarone use, and frequent blood transfusions.   Original Report Authenticated By: Sterling Big, M.D.    Ct Chest Wo Contrast  06/27/2011  **ADDENDUM** CREATED: 06/27/2011 12:10:48  I have reviewed this case with Dr. Nydia Bouton. There is a new 7mm superior segment left lower lobe pulmonary nodule on image number 23.  The patient is scheduled for a PET CT.  **END ADDENDUM** SIGNED BY: Cyndie Chime, M.D.   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  No pericardial mass.  **END ADDENDUM** SIGNED BY: Reyes Ivan, M.D.   06/25/2011  *RADIOLOGY REPORT*  Clinical Data: Evaluate chest anatomy.  Lesion pushing on the right atrium.  Fall.  CT CHEST WITHOUT CONTRAST  Technique:  Multidetector CT imaging of the chest was performed following the standard protocol without IV contrast.  Comparison: 11/15/2008 and 12/09/2006.   Findings: Mediastinal lymph nodes are not enlarged by CT size criteria.  Hilar regions are difficult to definitively evaluate without IV contrast.  No axillary adenopathy.  Atherosclerotic calcification of the arterial vasculature, including coronary arteries.  Heart size normal.  No pericardial effusion.  The right hemidiaphragm is elevated, with the liver exerting slight mass effect on the right heart border.  Biapical pleural parenchymal scarring, as before.  There is fairly diffuse but mild peribronchovascular nodularity and scattered bronchiectasis, progressive from 11/15/2008.  A cystic and solid spiculated nodule in the left upper lobe measures approximately 0.7 x 1.9 cm (images 17 18).  The lesion appears to have progressed from 11/15/2008.  There is chronic atelectasis and bronchiectasis in the right middle lobe and right lower lobe, adjacent to an elevated right hemidiaphragm.  No pleural fluid.  Airway is unremarkable.  Incidental imaging of the upper abdomen shows no acute findings. No worrisome lytic or sclerotic lesions.  IMPRESSION:  1.  Elevated right hemidiaphragm.  Liver exerts slight mass effect on the right heart border.  No mass. 2.  Spiculated left upper lobe nodule, containing cystic and solid components, which appears progressive from 11/15/2008.  Finding is worrisome for primary bronchogenic carcinoma. Consultation with the [facility] [phone]) should be considered. These results will be called to the ordering clinician or representative by the Radiologist Assistant, and communication documented in the PACS Dashboard. 3.  Mildly progressive pattern of peribronchovascular nodularity and bronchiectasis, indicative of mycobacterium avium complex (MAC).  Original Report Authenticated By: P. Loralie Champagne, M.D.   Mr Brain Wo Contrast  06/24/2011  *RADIOLOGY REPORT*  Clinical Data:  Acute left sided numbness and weakness. Hyperlipidemia.  MRI HEAD  WITHOUT CONTRAST MRA HEAD WITHOUT CONTRAST  Technique: Multiplanar, multiecho pulse sequences of the brain and surrounding structures were obtained according to standard protocol without intravenous contrast.  Angiographic images of the head were obtained using MRA technique without contrast.  Comparison: None.  MRI HEAD  Findings:  Small acute non hemorrhagic infarcts anterior right pons and mid right paracentral pontine region.  Several small remote infarcts within the cerebellum bilaterally.  Moderate small vessel disease type changes.  Global atrophy without hydrocephalus.  No intracranial hemorrhage.  No intracranial mass lesion detected on this unenhanced exam.  Partial opacification left mastoid air cells.  No obstructing lesion causing eustachian tube dysfunction detected.  Partial opacification/mucosal thickening ethmoid sinus air cells.  Mild transverse ligament hypertrophy.  Mild cervical spondylotic changes C3-4 and C4-5.  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  Several small remote infarcts within the cerebellum bilaterally.  Moderate small vessel disease type changes.  Global atrophy without hydrocephalus.  Partial opacification left mastoid air cells.  MRA HEAD  Findings: Ectatic irregular cavernous and supraclinoid segment of the internal carotid artery bilaterally with there is mild narrowing but without high-grade stenosis.  Ectatic A1 segment of the right anterior cerebral artery.  Middle cerebral artery and A2 segment anterior cerebral artery branch vessel mild to slightly moderate irregularity and narrowing.  Right vertebral artery is dominant and ectatic with mild irregularity.  Poor delineation right PICA.  Moderate long segment narrowing of the distal left vertebral artery.  Ectatic basilar artery with mild irregularity without high-grade stenosis.  Poor delineation left AICA.  Mild ectasia posterior cerebral arteries  with distal branch vessel irregularity and narrowing.  Mild bulge distal M1 segment right middle cerebral artery from which a vessel arises without discrete saccular aneurysm or vascular malformation.  IMPRESSION: Intracranial atherosclerotic type changes as detailed above.  Original Report Authenticated By: Fuller Canada, M.D.   Nm Pet Image Initial (pi) Skull Base To Thigh  07/08/2011  *RADIOLOGY REPORT*  Clinical Data: Initial treatment strategy for lung nodule.  NUCLEAR MEDICINE PET SKULL BASE TO THIGH  Fasting Blood Glucose:  92  Technique:  18.6 mCi F-18 FDG was injected intravenously. CT data was obtained and used for attenuation correction and anatomic localization only.  (This was not acquired as a diagnostic CT examination.) Additional exam technical data entered on technologist worksheet.  Comparison:  CT chest dated 06/24/2011  Findings:  Neck: No hypermetabolic lymph nodes in the neck.  Chest:  13 x 7 mm nodule with central lucency in the left upper lobe (series 2/image 69), max SUV 2.7, suspicious for primary bronchogenic neoplasm.  Additional 8 mm nodule in the superior segment left lower lobe (series 2/image 73), max SUV 1.7, also suspicious for primary bronchogenic neoplasm.  4 mm nodule in the left lower lobe (series 2/image 90), unchanged from remote prior studies, likely benign.  No hypermetabolic mediastinal or hilar nodes.  Coronary atherosclerosis.  Abdomen/Pelvis:  No abnormal hypermetabolic activity within the liver, pancreas, adrenal glands, or spleen.  Scattered areas of nonspecific GI uptake.  No hypermetabolic lymph nodes in the abdomen or pelvis.  Atherosclerotic calcifications of the aortic arch and branch vessels.  [place]:  No focal hypermetabolic activity to suggest skeletal metastasis.  Old left superior and inferior pubic ramus fracture deformities.  IMPRESSION: 13 mm nodule with central lucency in the left upper lobe, max SUV 2.7, suspicious for primary bronchogenic neoplasm.   Additional 8 mm nodule in the superior segment left lower lobe, max SUV 1.7, suspicious for synchronous primary bronchogenic neoplasm.  No evidence of metastatic disease.  Original Report Authenticated By: Charline Bills, M.D.    Recent Lab Findings: Lab Results  Component Value Date   WBC 4.4* 10/01/2011   HGB 11.6* 10/01/2011   HCT 35.7* 10/01/2011   PLT 144.0* 10/01/2011   GLUCOSE 80 09/01/2011   CHOL 194 06/25/2011   TRIG 123 06/25/2011   HDL 51 06/25/2011   LDLDIRECT 144.8 11/21/2010   LDLCALC 118* 06/25/2011   ALT 16 10/01/2011   AST 25  10/01/2011   NA 136 09/01/2011   K 4.8 09/01/2011   CL 98 09/01/2011   CREATININE 0.8 09/01/2011   BUN 17 09/01/2011   CO2 28 09/01/2011   TSH 5.076* 06/24/2011   INR 1.12 06/24/2011   HGBA1C 5.3 06/25/2011      Assessment / Plan:     Patient is a 71 year old former smoker, on long-term enbral for rheumatoid arthritis She's had a recent stroke in may 2013  involving the left arm and leg, and incidentally found to have a left upper lobe lung nodule that has increased in size since 2010 and a new lung nodule in the superior segment of the left lower lobe that is new since 2010. It is unclear if these are related to her rheumatoid disease or development of the underlying malignancy.     Stable to incrementally enlarged cavitary left upper lobe pulmonary nodule compared to 06/24/2011.  Stable hypermetabolic nodule in the superior segment of the left lower lobe.  Additional scattered calcified and noncalcified pulmonary nodules are also unchanged.  I discussed the new findings of this CT scan and previous  PET scan with the patient and her family. They  are aware that currently we have no definite tissue diagnosis of multiple lesions that could be rheumatoid related or malignancy. The patient prefers to wait  Additional 4  months for followup scan and then readdress possible needle biopsy.  With the patients frail condition she is reluctant to pursue an  aggressive course if there is minimal if any increase in size of the nodules.  Needle bx is being  considered, if positive for ca could consider stereotactic radiation rx  I will see back with repeat ct in 4 months  Delight Ovens MD  Beeper [phone] Office [phone] 11/06/2011 5:17 PM

## 2011-12-29 ENCOUNTER — Telehealth: Payer: Self-pay | Admitting: Internal Medicine

## 2011-12-29 ENCOUNTER — Other Ambulatory Visit: Payer: Self-pay | Admitting: Internal Medicine

## 2011-12-29 DIAGNOSIS — Z1211 Encounter for screening for malignant neoplasm of colon: Secondary | ICD-10-CM

## 2011-12-29 NOTE — Telephone Encounter (Signed)
Called pt and informed her that referral has been sent to [facility] and pt should here something this week or next as noted.

## 2011-12-29 NOTE — Telephone Encounter (Signed)
Pt called and said that Dr Dwain Sarna is suggestion that pt get a colonoscopy done this year. Pt is req that Dr Leretha Pol order and also do the referral for it. Pls call.

## 2011-12-29 NOTE — Telephone Encounter (Signed)
Please let pt know that the referral was sent to [facility] and she should hear something this week or next

## 2011-12-31 ENCOUNTER — Encounter: Payer: Self-pay | Admitting: Internal Medicine

## 2012-01-14 ENCOUNTER — Other Ambulatory Visit: Payer: Self-pay | Admitting: *Deleted

## 2012-01-14 DIAGNOSIS — N393 Stress incontinence (female) (male): Secondary | ICD-10-CM

## 2012-01-14 DIAGNOSIS — N3289 Other specified disorders of bladder: Secondary | ICD-10-CM

## 2012-01-14 MED ORDER — FESOTERODINE FUMARATE ER 8 MG PO TB24: 8.0000 mg | ORAL_TABLET | Freq: Every day | ORAL | Status: DC

## 2012-01-22 ENCOUNTER — Ambulatory Visit (AMBULATORY_SURGERY_CENTER): Payer: MEDICARE | Admitting: *Deleted

## 2012-01-22 VITALS — Ht 59.0 in | Wt 124.4 lb

## 2012-01-22 DIAGNOSIS — Z1211 Encounter for screening for malignant neoplasm of colon: Secondary | ICD-10-CM

## 2012-01-22 MED ORDER — MOVIPREP 100 G PO SOLR: ORAL | Status: DC

## 2012-02-19 ENCOUNTER — Ambulatory Visit (AMBULATORY_SURGERY_CENTER): Payer: MEDICARE | Admitting: Internal Medicine

## 2012-02-19 ENCOUNTER — Encounter: Payer: Self-pay | Admitting: Internal Medicine

## 2012-02-19 VITALS — BP 150/80 | HR 77 | Temp 97.0°F | Resp 20 | Ht 59.0 in | Wt 124.0 lb

## 2012-02-19 DIAGNOSIS — Z1211 Encounter for screening for malignant neoplasm of colon: Secondary | ICD-10-CM

## 2012-02-19 MED ORDER — SODIUM CHLORIDE 0.9 % IV SOLN: 500.0000 mL | INTRAVENOUS | Status: DC

## 2012-02-19 NOTE — Op Note (Signed)
[facility] [address] [place], [address]   COLONOSCOPY PROCEDURE REPORT  PATIENT: Kristen Lee, Kristen Lee  MR#: [id number] BIRTHDATE: 1941/01/05 , 71  yrs. old GENDER: Female ENDOSCOPIST: Roxy Cedar, MD REFERRED MW:[id number] Dwain Sarna, M.D. PROCEDURE DATE:  02/19/2012 PROCEDURE:   Colonoscopy, screening ASA CLASS:   Class II INDICATIONS:Average risk patient for colon cancer.   Reports hx "bowel obstruction" MEDICATIONS: MAC sedation, administered by CRNA and propofol (Diprivan) 250mg  IV  DESCRIPTION OF PROCEDURE:   After the risks benefits and alternatives of the procedure were thoroughly explained, informed consent was obtained.  A digital rectal exam revealed no abnormalities of the rectum.   The LB [id number]  endoscope was introduced through the anus and advanced to the cecum, which was identified by both the appendix and ileocecal valve. No adverse events experienced.   The quality of the prep was good, using MoviPrep  The instrument was then slowly withdrawn as the colon was fully examined.      COLON FINDINGS: The mucosa appeared normal in the terminal ileum. A normal appearing cecum, ileocecal valve, and appendiceal orifice were identified.  The ascending, hepatic flexure, transverse, splenic flexure, descending, sigmoid colon and rectum appeared unremarkable.  No polyps or cancers were seen.  Retroflexed views revealed internal hemorrhoids. The time to cecum=4 minutes 03 seconds.  Withdrawal time=10 minutes 01 seconds.  The scope was withdrawn and the procedure completed. COMPLICATIONS: There were no complications.  ENDOSCOPIC IMPRESSION: 1.   Normal mucosa in the terminal ileum 2.   Normal colon  RECOMMENDATIONS: 1. Return to the care of your primary provider.  GI follow up as needed   eSigned:  Roxy Cedar, MD 02/19/2012 12:48 PM   cc: Stacie Glaze, MD, Emelia Loron, MD, and The Patient

## 2012-02-19 NOTE — Progress Notes (Signed)
Patient did not experience any of the following events: a burn prior to discharge; a fall within the facility; wrong site/side/patient/procedure/implant event; or a hospital transfer or hospital admission upon discharge from the facility. (G8907) Patient did not have preoperative order for IV antibiotic SSI prophylaxis. (G8918)  

## 2012-02-19 NOTE — Patient Instructions (Addendum)

## 2012-02-19 NOTE — Progress Notes (Signed)
VVS, A&OX3, pleased with MAC, Report to [facility]

## 2012-02-20 ENCOUNTER — Telehealth: Payer: Self-pay | Admitting: *Deleted

## 2012-02-20 NOTE — Telephone Encounter (Signed)
  Follow up Call-  Call back number 02/19/2012  Post procedure Call Back phone  # [phone]     or cell  [phone]  Permission to leave phone message Yes     Patient questions:  Do you have a fever, pain , or abdominal swelling? no Pain Score  0 *  Have you tolerated food without any problems? yes  Have you been able to return to your normal activities? yes  Do you have any questions about your discharge instructions: Diet   no Medications  no Follow up visit  no  Do you have questions or concerns about your Care? no  Actions: * If pain score is 4 or above: No action needed, pain <4.

## 2012-02-24 ENCOUNTER — Other Ambulatory Visit: Payer: Self-pay | Admitting: Internal Medicine

## 2012-02-27 ENCOUNTER — Other Ambulatory Visit: Payer: Self-pay | Admitting: *Deleted

## 2012-02-27 DIAGNOSIS — R918 Other nonspecific abnormal finding of lung field: Secondary | ICD-10-CM

## 2012-03-25 ENCOUNTER — Ambulatory Visit: Payer: MEDICARE | Admitting: Cardiothoracic Surgery

## 2012-03-25 ENCOUNTER — Inpatient Hospital Stay: Admission: RE | Admit: 2012-03-25 | Payer: MEDICARE | Source: Ambulatory Visit

## 2012-04-01 ENCOUNTER — Other Ambulatory Visit: Payer: Self-pay | Admitting: Internal Medicine

## 2012-04-22 ENCOUNTER — Other Ambulatory Visit: Payer: MEDICARE

## 2012-04-22 ENCOUNTER — Ambulatory Visit: Payer: MEDICARE | Admitting: Cardiothoracic Surgery

## 2012-04-22 LAB — BUN: BUN: 16 mg/dL (ref 6–23)

## 2012-04-22 LAB — CREATININE, SERUM: Creat: 0.91 mg/dL (ref 0.50–1.10)

## 2012-04-23 ENCOUNTER — Ambulatory Visit: Payer: MEDICARE | Admitting: Cardiothoracic Surgery

## 2012-04-23 ENCOUNTER — Ambulatory Visit (INDEPENDENT_AMBULATORY_CARE_PROVIDER_SITE_OTHER): Payer: MEDICARE | Admitting: Cardiothoracic Surgery

## 2012-04-23 ENCOUNTER — Encounter: Payer: Self-pay | Admitting: Cardiothoracic Surgery

## 2012-04-23 ENCOUNTER — Ambulatory Visit
Admission: RE | Admit: 2012-04-23 | Discharge: 2012-04-23 | Disposition: A | Discharge status: Home / Self Care | Payer: MEDICARE | Source: Ambulatory Visit | Attending: Cardiothoracic Surgery | Admitting: Cardiothoracic Surgery

## 2012-04-23 VITALS — BP 140/83 | HR 90 | Resp 18 | Ht 59.0 in | Wt 124.0 lb

## 2012-04-23 DIAGNOSIS — R918 Other nonspecific abnormal finding of lung field: Secondary | ICD-10-CM

## 2012-04-23 MED ORDER — IOHEXOL 300 MG/ML  SOLN
75.0000 mL | Freq: Once | INTRAMUSCULAR | Status: AC | PRN
  Administered 2012-04-23: 75 mL via INTRAVENOUS

## 2012-04-23 NOTE — Patient Instructions (Signed)
CT of chest appears stable Follow up ct in 6 months

## 2012-04-23 NOTE — Progress Notes (Signed)
[address]       [place] [address]             [phone]        Kristen Lee [facility] Medical Record #[id number] Date of Birth: August 19, 1940  Referring: Eddie North, MD Primary Care: Carrie Mew, MD  Chief Complaint:    Chief Complaint  Patient presents with  . Follow-up    4 month f/u with Chest CT, sureveillance of lung nodules    History of Present Illness:    Patient is a 72 year old female had an episode of dizziness and left arm and leg numbness and was evaluated by the stroke team at [place] 06/2011 . She was kept in the hospital approximately 24 hours during that time. A chest x-ray and CT scan of the chest was performed raising the issue of a new left lung nodule. The patient has a history of rheumatoid arthritis and has been on Enbrel for approximately 10 years she is not currently taking. She is a former smoker  but quit smoking 27 years ago.  The patient has improved since the time of her stroke but still has left arm tingling and weakness.  She has known underlying COPD and occasionally wheezes. Patient notes that she had increased wheezing this morning used her inhalers  Current Activity/ Functional Status: Patient is not independent with mobility/ambulation, transfers, ADL's, IADL's.   Past Medical History  Diagnosis Date  . Thyroid disease   . Lupus   . Arthritis     rheumatoid and osteoarthritis  . Chicken pox as a child  . Shingles   . Measles as a child  . Mumps as a child  . History of shingles 11/25/2010  . Lung mass   . Hypertension   . Hyperlipidemia   . CVA (cerebrovascular accident) june 2013    Past Surgical History  Procedure Date  . Abdominal hysterectomy     partial still has ovaries  . Tonsillectomy and adenoidectomy as a child  . Removal of blockage exploratory lab lysis of adhesions 02-03-10    Family History  Problem Relation Age of Onset  . Heart disease Mother   . Hyperlipidemia  Mother   . Hypertension Mother   . Emphysema Father     smoked  . Heart attack Father     massive  . Heart disease Father     MI  . Other Son     shingles  . Cancer Paternal Grandmother     History   Social History  . Marital Status: Married    Spouse Name: N/A    Number of Children: N/A  . Years of Education: N/A   Occupational History  . Worked at [employer]   Social History Main Topics  . Smoking status: Former Smoker -- 2.0 packs/day for 15 years    Types: Cigarettes    Quit date: 02/03/1990  . Smokeless tobacco: Never Used  . Alcohol Use: No  . Drug Use: No  . Sexually Active: No      History  Smoking status  . Former Smoker -- 2.00 packs/day for 15 years  . Types: Cigarettes  . Quit date: 02/03/1990  Smokeless tobacco  . Never Used    History  Alcohol Use No     Allergies  Allergen Reactions  . Penicillins Swelling    Current Outpatient Prescriptions  Medication Sig Dispense Refill  . aspirin 325 MG tablet Take 1 tablet (325 mg total)  by mouth daily.  30 tablet  3  . calcium carbonate (OS-CAL) 600 MG TABS Take 600 mg by mouth daily.        . fesoterodine (TOVIAZ) 8 MG TB24 Take 1 tablet (8 mg total) by mouth daily.  90 tablet  3  . Fluticasone-Salmeterol (ADVAIR) 100-50 MCG/DOSE AEPB Inhale 1 puff into the lungs every 12 (twelve) hours.      . folic acid (FOLVITE) 400 MCG tablet Take 400 mcg by mouth daily.        Marland Kitchen leflunomide (ARAVA) 20 MG tablet Take 20 mg by mouth daily.      Marland Kitchen levothyroxine (SYNTHROID, LEVOTHROID) 75 MCG tablet Take 1 tablet (75 mcg total) by mouth daily.  30 tablet  2  . MICARDIS 40 MG tablet TAKE 1 TABLET BY MOUTH EVERY DAY  30 tablet  4  . simvastatin (ZOCOR) 20 MG tablet Take 1 tablet (20 mg total) by mouth every evening.  30 tablet  2  . TL ICON capsule Take 1 capsule daily  90 capsule  3  . traMADol (ULTRAM) 50 MG tablet Take 50 mg by mouth every 6 (six) hours as needed. For pain      . venlafaxine XR  (EFFEXOR-XR) 75 MG 24 hr capsule Take 1 capsule (75 mg total) by mouth daily.  90 capsule  3  . [DISCONTINUED] fesoterodine (TOVIAZ) 8 MG TB24 Take 1 tablet (8 mg total) by mouth daily.  90 tablet  1   No current facility-administered medications for this visit.       Review of Systems:     Cardiac Review of Systems: Y or N  Chest Pain [  n  ]  Resting SOB [   ] Exertional SOB  [ y ]  Orthopnea Cove.Etienne  ]   Pedal Edema [ y  ]    Palpitations Milo.Brash  ] Syncope  [ n ]   Presyncope [ y ]  General Review of Systems: [Y] = yes [  ]=no Constitional: recent weight change [ y ]; anorexia [  ]; fatigue [  ]; nausea [  ]; night sweats [  ]; fever [  ]; or chills [  ];                                                                                                                                          Dental: poor dentition[ n ]; Last Dentist visit:   Eye : blurred vision [ y ]; diplopia [   ]; vision changes [  ];  Amaurosis fugax[y  ]; Resp: cough [  ];  wheezing[yn  ];  hemoptysis[  ]; shortness of breath[n  ]; paroxysmal nocturnal dyspnea[ n ]; dyspnea on exertion[y  ]; or orthopnea[y  ];  GI:  gallstones[  ], vomiting[  ];  dysphagia[  ]; melena[  ];  hematochezia [  ];  heartburn[  ];   Hx of  Colonoscopy[  ]; GU: kidney stones [  ]; hematuria[  ];   dysuria [  ];  nocturia[  ];  history of     obstruction [  ];             Skin: rash, swelling[  ];, hair loss[  ];  peripheral edema[  ];  or itching[  ]; Musculosketetal: myalgias[y  ];  joint swelling[y  ];  joint erythema[ y ];  joint pain[ y ];  back pain[  y];  Heme/Lymph: bruising[  ];  bleeding[  ];  anemia[  ];  Neuro: TIA[ n ];  headaches[ h ];  stroke[recent  ];  vertigo[ y ];  seizures[n  ];   paresthesias[ n ];  difficulty walking[ y ];  Psych:depression[y  ]; anxiety[  ];  Endocrine: diabetes[  ];  thyroid dysfunction[ y ];  Immunizations: Flu [  ]; Pneumococcal[  ];  Other:  Physical Exam: BP 140/83  Pulse 90  Resp 18  Ht 4\' 11"   (1.499 m)  Wt 124 lb (56.246 kg)  BMI 25.03 kg/m2  SpO2 92%  General appearance: alert, cooperative, appears older than stated age, cachectic, fatigued and no distress Neurologic: intact Heart: regular rate and rhythm, S1, S2 normal, no murmur, click, rub or gallop and normal apical impulse Lungs: clear to auscultation bilaterally and normal percussion bilaterally Abdomen: soft, non-tender; bowel sounds normal; no masses,  no organomegaly Extremities: varicose veins noted and venous stasis dermatitis noted She has numerous scarred areas on the lower extremities from previous slowly here healing ulcerations   Diagnostic Studies & Laboratory data: Ct Chest W Contrast  04/23/2012  *RADIOLOGY REPORT*  Clinical Data: Follow-up of pulmonary nodules.  CT CHEST WITH CONTRAST  Technique:  Multidetector CT imaging of the chest was performed following the standard protocol during bolus administration of intravenous contrast.  Contrast: 75mL OMNIPAQUE IOHEXOL 300 MG/ML  SOLN  Comparison: Chest CT scans dated 10/03 and 06/24/2011 and 11/15/2008  Findings: The multiple bilateral pulmonary nodules are unchanged since 11/06/2011.  The cavitary lesion in the left upper lobe is slightly more prominent than on the 06/24/2011.   No new nodules.  No hilar or mediastinal adenopathy.  Heart size is normal.  There is extensive atheromatous disease in the thoracic aorta.  IMPRESSION: 1. All of the pulmonary nodules are stable since 11/06/2011. 2.  The cavitary lesion in the left upper lobe has not changed since 11/06/2011 but is slightly more prominent than on the study of 06/24/2011.  I recommend additional 25-month followup without contrast.   Original Report Authenticated By: Francene Boyers, M.D.       PFTs are performed FEV1 is 0.98 fusion capacity of 44% moderately severe obstructive airway disease and severe diffusion defect     Ct Chest Wo Contrast  06/27/2011  **ADDENDUM** CREATED: 06/27/2011 12:10:48  I have  reviewed this case with Dr. Nydia Bouton. There is a new 7mm superior segment left lower lobe pulmonary nodule on image number 23.  The patient is scheduled for a PET CT.  **END ADDENDUM** SIGNED BY: Cyndie Chime, M.D.   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  No pericardial mass.  **END ADDENDUM** SIGNED BY: Reyes Ivan, M.D.   06/25/2011  *RADIOLOGY REPORT*  Clinical Data: Evaluate chest anatomy.  Lesion pushing on the right atrium.  Fall.  CT CHEST WITHOUT CONTRAST  Technique:  Multidetector  CT imaging of the chest was performed following the standard protocol without IV contrast.  Comparison: 11/15/2008 and 12/09/2006.  Findings: Mediastinal lymph nodes are not enlarged by CT size criteria.  Hilar regions are difficult to definitively evaluate without IV contrast.  No axillary adenopathy.  Atherosclerotic calcification of the arterial vasculature, including coronary arteries.  Heart size normal.  No pericardial effusion.  The right hemidiaphragm is elevated, with the liver exerting slight mass effect on the right heart border.  Biapical pleural parenchymal scarring, as before.  There is fairly diffuse but mild peribronchovascular nodularity and scattered bronchiectasis, progressive from 11/15/2008.  A cystic and solid spiculated nodule in the left upper lobe measures approximately 0.7 x 1.9 cm (images 17 18).  The lesion appears to have progressed from 11/15/2008.  There is chronic atelectasis and bronchiectasis in the right middle lobe and right lower lobe, adjacent to an elevated right hemidiaphragm.  No pleural fluid.  Airway is unremarkable.  Incidental imaging of the upper abdomen shows no acute findings. No worrisome lytic or sclerotic lesions.  IMPRESSION:  1.  Elevated right hemidiaphragm.  Liver exerts slight mass effect on the right heart border.  No mass. 2.  Spiculated left upper lobe nodule, containing cystic and  solid components, which appears progressive from 11/15/2008.  Finding is worrisome for primary bronchogenic carcinoma. Consultation with the [facility] [phone]) should be considered. These results will be called to the ordering clinician or representative by the Radiologist Assistant, and communication documented in the PACS Dashboard. 3.  Mildly progressive pattern of peribronchovascular nodularity and bronchiectasis, indicative of mycobacterium avium complex (MAC).  Original Report Authenticated By: P. Loralie Champagne, M.D.   Mr Brain Wo Contrast  06/24/2011  *RADIOLOGY REPORT*  Clinical Data:  Acute left sided numbness and weakness. Hyperlipidemia.  MRI HEAD WITHOUT CONTRAST MRA HEAD WITHOUT CONTRAST  Technique: Multiplanar, multiecho pulse sequences of the brain and surrounding structures were obtained according to standard protocol without intravenous contrast.  Angiographic images of the head were obtained using MRA technique without contrast.  Comparison: None.  MRI HEAD  Findings:  Small acute non hemorrhagic infarcts anterior right pons and mid right paracentral pontine region.  Several small remote infarcts within the cerebellum bilaterally.  Moderate small vessel disease type changes.  Global atrophy without hydrocephalus.  No intracranial hemorrhage.  No intracranial mass lesion detected on this unenhanced exam.  Partial opacification left mastoid air cells.  No obstructing lesion causing eustachian tube dysfunction detected.  Partial opacification/mucosal thickening ethmoid sinus air cells.  Mild transverse ligament hypertrophy.  Mild cervical spondylotic changes C3-4 and C4-5.  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  Several small remote infarcts within the cerebellum bilaterally.  Moderate small vessel disease type changes.  Global atrophy without hydrocephalus.  Partial opacification  left mastoid air cells.  MRA HEAD  Findings: Ectatic irregular cavernous and supraclinoid segment of the internal carotid artery bilaterally with there is mild narrowing but without high-grade stenosis.  Ectatic A1 segment of the right anterior cerebral artery.  Middle cerebral artery and A2 segment anterior cerebral artery branch vessel mild to slightly moderate irregularity and narrowing.  Right vertebral artery is dominant and ectatic with mild irregularity.  Poor delineation right PICA.  Moderate long segment narrowing of the distal left vertebral artery.  Ectatic basilar artery with mild irregularity without high-grade stenosis.  Poor delineation left AICA.  Mild ectasia posterior cerebral arteries with distal branch vessel  irregularity and narrowing.  Mild bulge distal M1 segment right middle cerebral artery from which a vessel arises without discrete saccular aneurysm or vascular malformation.  IMPRESSION: Intracranial atherosclerotic type changes as detailed above.  Original Report Authenticated By: Fuller Canada, M.D.   Nm Pet Image Initial (pi) Skull Base To Thigh  07/08/2011  *RADIOLOGY REPORT*  Clinical Data: Initial treatment strategy for lung nodule.  NUCLEAR MEDICINE PET SKULL BASE TO THIGH  Fasting Blood Glucose:  92  Technique:  18.6 mCi F-18 FDG was injected intravenously. CT data was obtained and used for attenuation correction and anatomic localization only.  (This was not acquired as a diagnostic CT examination.) Additional exam technical data entered on technologist worksheet.  Comparison:  CT chest dated 06/24/2011  Findings:  Neck: No hypermetabolic lymph nodes in the neck.  Chest:  13 x 7 mm nodule with central lucency in the left upper lobe (series 2/image 69), max SUV 2.7, suspicious for primary bronchogenic neoplasm.  Additional 8 mm nodule in the superior segment left lower lobe (series 2/image 73), max SUV 1.7, also suspicious for primary bronchogenic neoplasm.  4 mm nodule in the  left lower lobe (series 2/image 90), unchanged from remote prior studies, likely benign.  No hypermetabolic mediastinal or hilar nodes.  Coronary atherosclerosis.  Abdomen/Pelvis:  No abnormal hypermetabolic activity within the liver, pancreas, adrenal glands, or spleen.  Scattered areas of nonspecific GI uptake.  No hypermetabolic lymph nodes in the abdomen or pelvis.  Atherosclerotic calcifications of the aortic arch and branch vessels.  [place]:  No focal hypermetabolic activity to suggest skeletal metastasis.  Old left superior and inferior pubic ramus fracture deformities.  IMPRESSION: 13 mm nodule with central lucency in the left upper lobe, max SUV 2.7, suspicious for primary bronchogenic neoplasm.  Additional 8 mm nodule in the superior segment left lower lobe, max SUV 1.7, suspicious for synchronous primary bronchogenic neoplasm.  No evidence of metastatic disease.  Original Report Authenticated By: Charline Bills, M.D.    Recent Lab Findings: Lab Results  Component Value Date   WBC 4.4* 10/01/2011   HGB 11.6* 10/01/2011   HCT 35.7* 10/01/2011   PLT 144.0* 10/01/2011   GLUCOSE 80 09/01/2011   CHOL 194 06/25/2011   TRIG 123 06/25/2011   HDL 51 06/25/2011   LDLDIRECT 144.8 11/21/2010   LDLCALC 118* 06/25/2011   ALT 16 10/01/2011   AST 25 10/01/2011   NA 136 09/01/2011   K 4.8 09/01/2011   CL 98 09/01/2011   CREATININE 0.91 02/27/2012   BUN 16 02/27/2012   CO2 28 09/01/2011   TSH 5.076* 06/24/2011   INR 1.12 06/24/2011   HGBA1C 5.3 06/25/2011      Assessment / Plan:     Patient is a 72 year old former smoker, on long-term enbral for rheumatoid arthritis She's had a recent stroke in may 2013  involving the left arm and leg, and incidentally found to have a left upper lobe lung nodule that has increased in size since 2010 and a new lung nodule in the superior segment of the left lower lobe that is new since 2010. It is unclear if these are related to her rheumatoid disease or development of the  underlying malignancy.     Stable to incrementally enlarged cavitary left upper lobe pulmonary nodule compared to 06/24/2011.  Stable hypermetabolic nodule in the superior segment of the left lower lobe.  Additional scattered calcified and noncalcified pulmonary nodules are also unchanged.  I discussed the new findings of  this CT scan and previous  PET scan with the patient and her family. They  are aware that currently we have no definite tissue diagnosis of multiple lesions that could be rheumatoid related or malignancy. . Since we have seen stable CT scan over the past 4 months the patient prefers not to proceed with any further invasive testing.  We'll plan this have an followup CT in 6 months.   With the patients frail condition she is reluctant to pursue an aggressive course if there is minimal if any increase in size of the nodules.    I will see back with repeat ct in 6  months  Delight Ovens MD  Beeper [phone] Office [phone] 04/23/2012 1:33 PM

## 2012-05-27 ENCOUNTER — Other Ambulatory Visit: Payer: Self-pay | Admitting: *Deleted

## 2012-05-27 MED ORDER — LEVOTHYROXINE SODIUM 75 MCG PO TABS: 75.0000 ug | ORAL_TABLET | Freq: Every day | ORAL | Status: AC

## 2012-05-27 MED ORDER — TELMISARTAN 40 MG PO TABS: ORAL_TABLET | ORAL | Status: DC

## 2012-06-15 ENCOUNTER — Telehealth: Payer: Self-pay | Admitting: Internal Medicine

## 2012-06-15 NOTE — Telephone Encounter (Signed)
Pt is aware appt made

## 2012-06-15 NOTE — Telephone Encounter (Signed)
Pt was on enbrel and requesting blood work. What labs can I sch for pt?

## 2012-06-15 NOTE — Telephone Encounter (Signed)
Pt is sch for 5-21

## 2012-06-15 NOTE — Telephone Encounter (Signed)
She can have cbc with diff,lft's and b-met- dx code is 695.4....&.....v13.89

## 2012-06-23 ENCOUNTER — Other Ambulatory Visit (INDEPENDENT_AMBULATORY_CARE_PROVIDER_SITE_OTHER): Payer: MEDICARE

## 2012-06-23 DIAGNOSIS — L930 Discoid lupus erythematosus: Secondary | ICD-10-CM

## 2012-06-23 DIAGNOSIS — Z87898 Personal history of other specified conditions: Secondary | ICD-10-CM

## 2012-06-23 LAB — HEPATIC FUNCTION PANEL
AST: 25 U/L (ref 0–37)
Alkaline Phosphatase: 135 U/L — ABNORMAL HIGH (ref 39–117)
Total Bilirubin: 0.5 mg/dL (ref 0.3–1.2)

## 2012-06-23 LAB — BASIC METABOLIC PANEL
GFR: 61.82 mL/min (ref >60.00)
Glucose, Bld: 86 mg/dL (ref 70–99)
Potassium: 4.9 mEq/L (ref 3.5–5.1)
Sodium: 137 mEq/L (ref 135–145)

## 2012-06-23 LAB — CBC WITH DIFFERENTIAL/PLATELET
Eosinophils Absolute: 0.1 10*3/uL (ref 0.0–0.7)
Eosinophils Relative: 2.8 % (ref 0.0–5.0)
Lymphocytes Relative: 24.8 % (ref 12.0–46.0)
MCV: 100.6 fl — ABNORMAL HIGH (ref 78.0–100.0)
Monocytes Absolute: 0.4 10*3/uL (ref 0.1–1.0)
Neutrophils Relative %: 62.5 % (ref 43.0–77.0)
Platelets: 147 10*3/uL — ABNORMAL LOW (ref 150.0–400.0)
WBC: 4.7 10*3/uL (ref 4.5–10.5)

## 2012-07-27 ENCOUNTER — Encounter (INDEPENDENT_AMBULATORY_CARE_PROVIDER_SITE_OTHER): Payer: MEDICARE

## 2012-07-27 ENCOUNTER — Ambulatory Visit (INDEPENDENT_AMBULATORY_CARE_PROVIDER_SITE_OTHER): Payer: MEDICARE | Admitting: Family

## 2012-07-27 ENCOUNTER — Encounter: Payer: Self-pay | Admitting: Family

## 2012-07-27 VITALS — BP 132/70 | HR 88 | Wt 121.0 lb

## 2012-07-27 DIAGNOSIS — M79609 Pain in unspecified limb: Secondary | ICD-10-CM

## 2012-07-27 DIAGNOSIS — M79604 Pain in right leg: Secondary | ICD-10-CM

## 2012-07-27 DIAGNOSIS — M7121 Synovial cyst of popliteal space [Baker], right knee: Secondary | ICD-10-CM

## 2012-07-27 DIAGNOSIS — M7989 Other specified soft tissue disorders: Secondary | ICD-10-CM

## 2012-07-27 DIAGNOSIS — M7120 Synovial cyst of popliteal space [Baker], unspecified knee: Secondary | ICD-10-CM

## 2012-07-27 DIAGNOSIS — M1990 Unspecified osteoarthritis, unspecified site: Secondary | ICD-10-CM

## 2012-07-27 MED ORDER — METHYLPREDNISOLONE 4 MG PO KIT: PACK | ORAL | Status: DC

## 2012-07-27 NOTE — Progress Notes (Signed)
Subjective:    Patient ID: Kristen Lee, female    DOB: [birth year], 72 y.o.   MRN: [id number]  HPI  72 year old white female who presents to PCP with R leg swelling starting at knee and extending down to R foot and calf tenderness x 3 weeks. Pt rates pain 8/10 states it is worse at night and describes pain as "sore" or "achy". Pain reports taking Tramadol which helps manages the pain, but states the pain returns.  Review of Systems  Constitutional: Negative.   HENT: Negative.   Eyes: Negative.   Respiratory: Negative.   Cardiovascular: Positive for leg swelling.  Gastrointestinal: Negative.   Endocrine: Negative.   Genitourinary: Negative.   Musculoskeletal: Negative.   Skin: Negative.   Allergic/Immunologic: Negative.   Neurological: Negative.   Hematological: Negative.   Psychiatric/Behavioral: Negative.    Past Medical History  Diagnosis Date  . Thyroid disease   . Lupus   . Arthritis     rheumatoid and osteoarthritis  . Chicken pox as a child  . Shingles   . Measles as a child  . Mumps as a child  . History of shingles 11/25/2010  . Lung mass   . Hypertension   . Hyperlipidemia   . CVA (cerebrovascular accident) june 2013    History   Social History  . Marital Status: Married    Spouse Name: N/A    Number of Children: N/A  . Years of Education: N/A   Occupational History  . Not on file.   Social History Main Topics  . Smoking status: Former Smoker -- 2.00 packs/day for 15 years    Types: Cigarettes    Quit date: 02/03/1990  . Smokeless tobacco: Never Used  . Alcohol Use: No  . Drug Use: No  . Sexually Active: No   Other Topics Concern  . Not on file   Social History Narrative  . No narrative on file    Past Surgical History  Procedure Laterality Date  . Abdominal hysterectomy      partial still has ovaries  . Tonsillectomy and adenoidectomy  as a child  . Removal of blockage  02-03-10    Family History  Problem Relation Age of  Onset  . Heart disease Mother   . Hyperlipidemia Mother   . Hypertension Mother   . Emphysema Father     smoked  . Heart attack Father     massive  . Heart disease Father     MI  . Other Son     shingles  . Cancer Paternal Grandmother     Allergies  Allergen Reactions  . Penicillins Swelling    Current Outpatient Prescriptions on File Prior to Visit  Medication Sig Dispense Refill  . calcium carbonate (OS-CAL) 600 MG TABS Take 600 mg by mouth daily.        . fesoterodine (TOVIAZ) 8 MG TB24 Take 1 tablet (8 mg total) by mouth daily.  90 tablet  3  . Fluticasone-Salmeterol (ADVAIR) 100-50 MCG/DOSE AEPB Inhale 1 puff into the lungs every 12 (twelve) hours.      . folic acid (FOLVITE) 400 MCG tablet Take 400 mcg by mouth daily.        Marland Kitchen leflunomide (ARAVA) 20 MG tablet Take 20 mg by mouth daily.      . simvastatin (ZOCOR) 20 MG tablet Take 1 tablet (20 mg total) by mouth every evening.  30 tablet  2  . TL ICON capsule Take 1 capsule  daily  90 capsule  3  . traMADol (ULTRAM) 50 MG tablet Take 50 mg by mouth every 6 (six) hours as needed. For pain      . venlafaxine XR (EFFEXOR-XR) 75 MG 24 hr capsule Take 1 capsule (75 mg total) by mouth daily.  90 capsule  3  . levothyroxine (SYNTHROID, LEVOTHROID) 75 MCG tablet Take 1 tablet (75 mcg total) by mouth daily before breakfast.  90 tablet  3  . telmisartan (MICARDIS) 40 MG tablet TAKE 1 TABLET BY MOUTH EVERY DAY  90 tablet  3  . [DISCONTINUED] fesoterodine (TOVIAZ) 8 MG TB24 Take 1 tablet (8 mg total) by mouth daily.  90 tablet  1   No current facility-administered medications on file prior to visit.    BP 132/70  Pulse 88  Wt 121 lb (54.885 kg)  BMI 24.43 kg/m2chart    Objective:   Physical Exam  Constitutional: She is oriented to person, place, and time. She appears well-developed and well-nourished.  HENT:  Head: Normocephalic and atraumatic.  Eyes: Conjunctivae are normal. Pupils are equal, round, and reactive to light.   Neck: Normal range of motion.  Cardiovascular: Normal rate and regular rhythm.   Pulmonary/Chest: Effort normal and breath sounds normal.  Abdominal: Soft. Bowel sounds are normal.  Musculoskeletal: Normal range of motion. She exhibits edema and tenderness.  Neurological: She is alert and oriented to person, place, and time.  Skin: Skin is warm.  Psychiatric: She has a normal mood and affect.          Assessment & Plan:  1. R knee and calf pain  Plan: A doppler U/S has been ordered to evaluate for DVT. PCP will follow up with pt on results of U/S and further management of symptoms. Results of ultrasound revealed a Baker's cyst. Patient still complaining of right knee pain. We'll send over Medrol Dosepak as directed.

## 2012-08-03 ENCOUNTER — Telehealth: Payer: Self-pay | Admitting: Internal Medicine

## 2012-08-03 MED ORDER — METHYLPREDNISOLONE 4 MG PO KIT: PACK | ORAL | Status: AC

## 2012-08-03 NOTE — Telephone Encounter (Signed)
Ok to send

## 2012-08-03 NOTE — Telephone Encounter (Signed)
done

## 2012-08-03 NOTE — Telephone Encounter (Signed)
Pt is no longer having leg pain,however pt would like another round of prednisone call into [facility]. Pt stated prednisone helped her and she can walk now. Please advise  Pt saw NP on 07-27-12

## 2012-08-17 ENCOUNTER — Other Ambulatory Visit: Payer: Self-pay | Admitting: *Deleted

## 2012-08-17 MED ORDER — VENLAFAXINE HCL ER 75 MG PO CP24: 75.0000 mg | ORAL_CAPSULE | Freq: Every day | ORAL | Status: AC

## 2012-09-30 ENCOUNTER — Other Ambulatory Visit: Payer: Self-pay | Admitting: *Deleted

## 2012-09-30 DIAGNOSIS — R918 Other nonspecific abnormal finding of lung field: Secondary | ICD-10-CM

## 2012-10-20 ENCOUNTER — Telehealth: Payer: Self-pay | Admitting: Internal Medicine

## 2012-10-20 NOTE — Telephone Encounter (Signed)
Pt is out of town at [facility] and needs refill of traMADol (ULTRAM) 50 MG tablet sent to [facility],  [address] [place]  Store [phone]) Tel [phone]

## 2012-10-21 ENCOUNTER — Other Ambulatory Visit: Payer: Self-pay | Admitting: *Deleted

## 2012-10-21 MED ORDER — TRAMADOL HCL 50 MG PO TABS: 50.0000 mg | ORAL_TABLET | Freq: Four times a day (QID) | ORAL | Status: DC | PRN

## 2012-10-21 NOTE — Telephone Encounter (Signed)
Called to pharmacy 

## 2012-11-04 ENCOUNTER — Encounter: Payer: Self-pay | Admitting: Cardiothoracic Surgery

## 2012-11-04 ENCOUNTER — Ambulatory Visit (INDEPENDENT_AMBULATORY_CARE_PROVIDER_SITE_OTHER): Payer: MEDICARE | Admitting: Cardiothoracic Surgery

## 2012-11-04 ENCOUNTER — Ambulatory Visit
Admission: RE | Admit: 2012-11-04 | Discharge: 2012-11-04 | Disposition: A | Discharge status: Home / Self Care | Payer: MEDICARE | Source: Ambulatory Visit | Attending: Cardiothoracic Surgery | Admitting: Cardiothoracic Surgery

## 2012-11-04 VITALS — BP 144/84 | HR 109 | Resp 16 | Ht 59.0 in | Wt 123.0 lb

## 2012-11-04 DIAGNOSIS — R918 Other nonspecific abnormal finding of lung field: Secondary | ICD-10-CM

## 2012-11-04 DIAGNOSIS — D381 Neoplasm of uncertain behavior of trachea, bronchus and lung: Secondary | ICD-10-CM

## 2012-11-04 NOTE — Progress Notes (Signed)
[address]       [place] [address]             [phone]          KAMYLAH MANZO [facility] Medical Record #[id number] Date of Birth: 1974/02/05  Referring: Eddie North, MD Primary Care: Carrie Mew, MD  Chief Complaint:    Chief Complaint  Patient presents with  . Lung Lesion    6 month f/u with CT CHEST    History of Present Illness:    Patient is a 72 year old female had an episode of dizziness and left arm and leg numbness and was evaluated by the stroke team at  06/2011 . She was kept in the hospital approximately 74 hours during that time. A chest x-ray and CT scan of the chest was performed raising the issue of a new left lung nodule. The patient has a history of rheumatoid arthritis and had  been on Enbrel for approximately 10 years. She is not currently taking. She is a former smoker  but quit smoking 27 years ago.  The patient has improved since the time of her stroke but still has left arm tingling and weakness.  She has known underlying COPD and occasionally wheezes. Patient notes that she had increased wheezing this morning used her inhalers  Three days ago she was seen for acute swelling of her right knee, she notes it's much improved today after aspiration of fluid and steroid injection  Current Activity/ Functional Status: Patient is not independent with mobility/ambulation, transfers, ADL's, IADL's. The patient requires assistance in ambulating.   Past Medical History  Diagnosis Date  . Thyroid disease   . Lupus   . Arthritis     rheumatoid and osteoarthritis  . Chicken pox as a child  . Shingles   . Measles as a child  . Mumps as a child  . History of shingles 11/25/2010  . Lung mass   . Hypertension   . Hyperlipidemia   . CVA (cerebrovascular accident) june 2013    Past Surgical History  Procedure Date  . Abdominal hysterectomy     partial still has ovaries  . Tonsillectomy and  adenoidectomy as a child  . Removal of blockage exploratory lab lysis of adhesions 02-03-10    Family History  Problem Relation Age of Onset  . Heart disease Mother   . Hyperlipidemia Mother   . Hypertension Mother   . Emphysema Father     smoked  . Heart attack Father     massive  . Heart disease Father     MI  . Other Son     shingles  . Cancer Paternal Grandmother     History   Social History  . Marital Status: Married    Spouse Name: N/A    Number of Children: N/A  . Years of Education: N/A   Occupational History  . Worked at [employer]   Social History Main Topics  . Smoking status: Former Smoker -- 2.0 packs/day for 15 years    Types: Cigarettes    Quit date: 02/03/1990  . Smokeless tobacco: Never Used  . Alcohol Use: No  . Drug Use: No  . Sexually Active: No      History  Smoking status  . Former Smoker -- 2.00 packs/day for 15 years  . Types: Cigarettes  . Quit date: 02/03/1990  Smokeless tobacco  . Never Used    History  Alcohol Use  No     Allergies  Allergen Reactions  . Penicillins Swelling    Current Outpatient Prescriptions  Medication Sig Dispense Refill  . acetaminophen (TYLENOL) 325 MG tablet Take 650 mg by mouth every 8 (eight) hours as needed for pain.      Marland Kitchen aspirin 325 MG EC tablet Take 325 mg by mouth daily.      . calcium carbonate (OS-CAL) 600 MG TABS Take 600 mg by mouth daily.        Marland Kitchen leflunomide (ARAVA) 20 MG tablet Take 20 mg by mouth daily.      Marland Kitchen levothyroxine (SYNTHROID, LEVOTHROID) 75 MCG tablet Take 1 tablet (75 mcg total) by mouth daily before breakfast.  90 tablet  3  . mirabegron ER (MYRBETRIQ) 25 MG TB24 tablet Take 25 mg by mouth 2 (two) times daily.      Marland Kitchen olmesartan (BENICAR) 20 MG tablet Take 20 mg by mouth daily.      . simvastatin (ZOCOR) 20 MG tablet Take 1 tablet (20 mg total) by mouth every evening.  30 tablet  2  . traMADol (ULTRAM) 50 MG tablet Take 1 tablet (50 mg total) by mouth every 6 (six)  hours as needed. For pain  50 tablet  0  . venlafaxine XR (EFFEXOR-XR) 75 MG 24 hr capsule Take 1 capsule (75 mg total) by mouth daily.  90 capsule  3  . [DISCONTINUED] fesoterodine (TOVIAZ) 8 MG TB24 Take 1 tablet (8 mg total) by mouth daily.  90 tablet  1   No current facility-administered medications for this visit.       Review of Systems:     Cardiac Review of Systems: Y or N  Chest Pain [  n  ]  Resting SOB [   ] Exertional SOB  [ y ]  Orthopnea Cove.Etienne  ]   Pedal Edema [ y  ]    Palpitations Milo.Brash  ] Syncope  [ n ]   Presyncope [ y ]  General Review of Systems: [Y] = yes [  ]=no Constitional: recent weight change [ y ]; anorexia [  ]; fatigue [  ]; nausea [  ]; night sweats [  ]; fever [  ]; or chills [  ];                                                                                                                                          Dental: poor dentition[ n ]; Last Dentist visit:   Eye : blurred vision [ y ]; diplopia [   ]; vision changes [  ];  Amaurosis fugax[y  ]; Resp: cough [  ];  wheezing[yn  ];  hemoptysis[  ]; shortness of breath[n  ]; paroxysmal nocturnal dyspnea[ n ]; dyspnea on exertion[y  ]; or orthopnea[y  ];  GI:  gallstones[  ], vomiting[  ];  dysphagia[  ];  melena[  ];  hematochezia [  ]; heartburn[  ];   Hx of  Colonoscopy[  ]; GU: kidney stones [  ]; hematuria[  ];   dysuria [  ];  nocturia[  ];  history of     obstruction [  ];             Skin: rash, swelling[  ];, hair loss[  ];  peripheral edema[  ];  or itching[  ]; Musculosketetal: myalgias[y  ];  joint swelling[y  ];  joint erythema[ y ];  joint pain[ y ];  back pain[  y];  Heme/Lymph: bruising[  ];  bleeding[  ];  anemia[  ];  Neuro: TIA[ n ];  headaches[ h ];  stroke[recent  ];  vertigo[ y ];  seizures[n  ];   paresthesias[ n ];  difficulty walking[ y ];  Psych:depression[y  ]; anxiety[  ];  Endocrine: diabetes[  ];  thyroid dysfunction[ y ];  Immunizations: Flu [  ]; Pneumococcal[   ];  Other:  Physical Exam: BP 144/84  Pulse 109  Resp 16  Ht 4\' 11"  (1.499 m)  Wt 123 lb (55.792 kg)  BMI 24.83 kg/m2  SpO2 95%  General appearance: alert, cooperative, appears older than stated age, cachectic, fatigued and no distress Neurologic: intact Heart: regular rate and rhythm, S1, S2 normal, no murmur, click, rub or gallop and normal apical impulse Lungs: clear to auscultation bilaterally and normal percussion bilaterally Abdomen: soft, non-tender; bowel sounds normal; no masses,  no organomegaly Extremities: varicose veins noted and venous stasis dermatitis noted She has numerous scarred areas on the lower extremities from previous slowly here healing ulcerations The right knee is still slightly swollen but she notes much improved from several days ago.  Diagnostic Studies & Laboratory data: Ct Chest Wo Contrast  11/04/2012   *RADIOLOGY REPORT*  Clinical Data: 5-month follow-up lung nodules.  CT CHEST WITHOUT CONTRAST  Technique:  Multidetector CT imaging of the chest was performed following the standard protocol without IV contrast.  Comparison: Chest CT 04/23/2012; 12/09/2006.  Findings: Visualized neck base is unremarkable.  No enlarged axillary, mediastinal or hilar lymphadenopathy.  Normal heart size. Coronary artery calcifications.  The central airways are patent.  Biapical scarring is redemonstrated.   Multiple bilateral pulmonary nodules are unchanged from prior examination including a 3 mm right upper lobe pulmonary nodule (image 13); 4 mm right middle lobe pulmonary nodule (image 12; series 4); 3 mm subpleural right lower lobe pulmonary nodule (image 23; series 4); 6 mm left lower lobe pulmonary nodule (image 35); and cavitary 1.7 x 0.7 cm left upper lobe pulmonary nodule (image 19).  Incidental imaging of the upper abdomen demonstrates unchanged mild dilation of the descending aorta at the hiatus measuring up to 3.2 cm.  No aggressive appearing osseous lesions.  Mid  thoracic spine degenerative change.  IMPRESSION:  1.  No significant interval change in bilateral pulmonary nodules including partially cavitary left upper lobe pulmonary nodule dating back to 11/06/2011.  An additional follow-up in 6 months may prove helpful to ensure stability. 2.  Redemonstrated ectatic descending thoracic aorta.   Original Report Authenticated By: Annia Belt, M.D   Ct Chest W Contrast  04/23/2012  *RADIOLOGY REPORT*  Clinical Data: Follow-up of pulmonary nodules.  CT CHEST WITH CONTRAST  Technique:  Multidetector CT imaging of the chest was performed following the standard protocol during bolus administration of intravenous contrast.  Contrast: 75mL OMNIPAQUE IOHEXOL 300 MG/ML  SOLN  Comparison: Chest  CT scans dated 10/03 and 06/24/2011 and 11/15/2008  Findings: The multiple bilateral pulmonary nodules are unchanged since 11/06/2011.  The cavitary lesion in the left upper lobe is slightly more prominent than on the 06/24/2011.   No new nodules.  No hilar or mediastinal adenopathy.  Heart size is normal.  There is extensive atheromatous disease in the thoracic aorta.  IMPRESSION: 1. All of the pulmonary nodules are stable since 11/06/2011. 2.  The cavitary lesion in the left upper lobe has not changed since 11/06/2011 but is slightly more prominent than on the study of 06/24/2011.  I recommend additional 37-month followup without contrast.   Original Report Authenticated By: Francene Boyers, M.D.      Previous  PFTs:FEV1 is 0.98 fusion capacity of 44% moderately severe obstructive airway disease and severe diffusion defect     Recent Lab Findings: Lab Results  Component Value Date   WBC 4.7 06/23/2012   HGB 13.3 06/23/2012   HCT 40.0 06/23/2012   PLT 147.0* 06/23/2012   GLUCOSE 86 06/23/2012   CHOL 194 06/25/2011   TRIG 123 06/25/2011   HDL 51 06/25/2011   LDLDIRECT 144.8 11/21/2010   LDLCALC 118* 06/25/2011   ALT 17 06/23/2012   AST 25 06/23/2012   NA 137 06/23/2012   K 4.9 06/23/2012    CL 99 06/23/2012   CREATININE 0.9 06/23/2012   BUN 15 06/23/2012   CO2 28 06/23/2012   TSH 5.076* 06/24/2011   INR 1.12 06/24/2011   HGBA1C 5.3 06/25/2011      Assessment / Plan:    Patient is a 72 year old former smoker, on long-term enbral for rheumatoid arthritis She's had a  stroke in may 2013  involving the left arm and leg, and incidentally found to have a left upper lobe lung nodule that has increased in size since 2010 and a new lung nodule in the superior segment of the left lower lobe that is new since 2010. It is unclear if these are related to her rheumatoid disease or development of the underlying malignancy.      No significant interval change in bilateral pulmonary nodules including partially cavitary left upper lobe pulmonary nodule dating back to 11/06/2011.  An additional follow-up in 6 months may prove helpful to ensure stability.   I discussed the new findings of this CT scan and previous  PET scan with the patient and her family. They  are aware that currently we have no definite tissue diagnosis of multiple lesions that could be rheumatoid related or malignancy. . Since we have seen stable CT scan since May 2013  the patient prefers not to proceed with any further invasive testing.    I will see back with repeat ct in 6  months  Delight Ovens MD  Beeper [phone] Office [phone] 11/04/2012 1:36 PM

## 2012-11-09 ENCOUNTER — Other Ambulatory Visit: Payer: Self-pay | Admitting: Internal Medicine

## 2012-12-07 ENCOUNTER — Other Ambulatory Visit: Payer: Self-pay | Admitting: *Deleted

## 2012-12-07 MED ORDER — TRAMADOL HCL 50 MG PO TABS: 50.0000 mg | ORAL_TABLET | Freq: Four times a day (QID) | ORAL | Status: AC | PRN

## 2013-01-13 ENCOUNTER — Emergency Department (HOSPITAL_COMMUNITY)
Admission: EM | Admit: 2013-01-13 | Discharge: 2013-01-13 | Disposition: A | Discharge status: Home / Self Care | Payer: MEDICARE | Attending: Emergency Medicine | Admitting: Emergency Medicine

## 2013-01-13 ENCOUNTER — Emergency Department (HOSPITAL_COMMUNITY): Payer: MEDICARE

## 2013-01-13 ENCOUNTER — Encounter (HOSPITAL_COMMUNITY): Payer: Self-pay | Admitting: Emergency Medicine

## 2013-01-13 DIAGNOSIS — R911 Solitary pulmonary nodule: Secondary | ICD-10-CM

## 2013-01-13 DIAGNOSIS — R0781 Pleurodynia: Secondary | ICD-10-CM

## 2013-01-13 DIAGNOSIS — R7989 Other specified abnormal findings of blood chemistry: Secondary | ICD-10-CM

## 2013-01-13 DIAGNOSIS — I7090 Unspecified atherosclerosis: Secondary | ICD-10-CM

## 2013-01-13 DIAGNOSIS — E079 Disorder of thyroid, unspecified: Secondary | ICD-10-CM | POA: Insufficient documentation

## 2013-01-13 DIAGNOSIS — Z8673 Personal history of transient ischemic attack (TIA), and cerebral infarction without residual deficits: Secondary | ICD-10-CM | POA: Insufficient documentation

## 2013-01-13 DIAGNOSIS — R160 Hepatomegaly, not elsewhere classified: Secondary | ICD-10-CM

## 2013-01-13 DIAGNOSIS — K7689 Other specified diseases of liver: Secondary | ICD-10-CM | POA: Insufficient documentation

## 2013-01-13 DIAGNOSIS — E785 Hyperlipidemia, unspecified: Secondary | ICD-10-CM | POA: Insufficient documentation

## 2013-01-13 DIAGNOSIS — M1990 Unspecified osteoarthritis, unspecified site: Secondary | ICD-10-CM | POA: Insufficient documentation

## 2013-01-13 DIAGNOSIS — M069 Rheumatoid arthritis, unspecified: Secondary | ICD-10-CM | POA: Insufficient documentation

## 2013-01-13 DIAGNOSIS — Z880 Allergy status to penicillin: Secondary | ICD-10-CM | POA: Insufficient documentation

## 2013-01-13 DIAGNOSIS — Z87891 Personal history of nicotine dependence: Secondary | ICD-10-CM | POA: Insufficient documentation

## 2013-01-13 DIAGNOSIS — Z8619 Personal history of other infectious and parasitic diseases: Secondary | ICD-10-CM | POA: Insufficient documentation

## 2013-01-13 DIAGNOSIS — Z79899 Other long term (current) drug therapy: Secondary | ICD-10-CM | POA: Insufficient documentation

## 2013-01-13 DIAGNOSIS — Z7982 Long term (current) use of aspirin: Secondary | ICD-10-CM | POA: Insufficient documentation

## 2013-01-13 DIAGNOSIS — I10 Essential (primary) hypertension: Secondary | ICD-10-CM | POA: Insufficient documentation

## 2013-01-13 DIAGNOSIS — R071 Chest pain on breathing: Secondary | ICD-10-CM | POA: Insufficient documentation

## 2013-01-13 LAB — POCT I-STAT TROPONIN I: Troponin i, poc: 0 ng/mL (ref 0.00–0.08)

## 2013-01-13 LAB — CBC WITH DIFFERENTIAL/PLATELET
Basophils Absolute: 0 10*3/uL (ref 0.0–0.1)
Lymphocytes Relative: 9 % — ABNORMAL LOW (ref 12–46)
Lymphs Abs: 0.7 10*3/uL (ref 0.7–4.0)
MCHC: 33.7 g/dL (ref 30.0–36.0)
MCV: 99.7 fL (ref 78.0–100.0)
Monocytes Absolute: 0.4 10*3/uL (ref 0.1–1.0)
Neutro Abs: 6.2 10*3/uL (ref 1.7–7.7)
Neutrophils Relative %: 84 % — ABNORMAL HIGH (ref 43–77)
Platelets: 196 10*3/uL (ref 150–400)
RBC: 3.87 MIL/uL (ref 3.87–5.11)
RDW: 13.2 % (ref 11.5–15.5)
WBC: 7.3 10*3/uL (ref 4.0–10.5)

## 2013-01-13 LAB — COMPREHENSIVE METABOLIC PANEL
ALT: 57 U/L — ABNORMAL HIGH (ref 0–35)
AST: 83 U/L — ABNORMAL HIGH (ref 0–37)
Alkaline Phosphatase: 247 U/L — ABNORMAL HIGH (ref 39–117)
BUN: 13 mg/dL (ref 6–23)
CO2: 25 mEq/L (ref 19–32)
Calcium: 9.9 mg/dL (ref 8.4–10.5)
Chloride: 93 mEq/L — ABNORMAL LOW (ref 96–112)
Creatinine, Ser: 0.54 mg/dL (ref 0.50–1.10)
GFR calc Af Amer: >90 mL/min (ref >90)
GFR calc non Af Amer: >90 mL/min (ref >90)
Glucose, Bld: 99 mg/dL (ref 70–99)
Potassium: 4.4 mEq/L (ref 3.5–5.1)
Sodium: 130 mEq/L — ABNORMAL LOW (ref 135–145)
Total Bilirubin: 0.7 mg/dL (ref 0.3–1.2)

## 2013-01-13 LAB — LIPASE, BLOOD: Lipase: 19 U/L (ref 11–59)

## 2013-01-13 MED ORDER — HYDROCODONE-ACETAMINOPHEN 5-325 MG PO TABS: 1.0000 | ORAL_TABLET | ORAL | Status: AC | PRN

## 2013-01-13 MED ORDER — ONDANSETRON HCL 4 MG/2ML IJ SOLN
4.0000 mg | Freq: Once | INTRAMUSCULAR | Status: AC
  Administered 2013-01-13: 4 mg via INTRAVENOUS
  Filled 2013-01-13: qty 2

## 2013-01-13 MED ORDER — IOHEXOL 350 MG/ML SOLN
100.0000 mL | Freq: Once | INTRAVENOUS | Status: AC | PRN
  Administered 2013-01-13: 100 mL via INTRAVENOUS

## 2013-01-13 MED ORDER — HYDROCODONE-ACETAMINOPHEN 5-325 MG PO TABS
1.0000 | ORAL_TABLET | Freq: Once | ORAL | Status: AC
  Administered 2013-01-13: 1 via ORAL
  Filled 2013-01-13: qty 1

## 2013-01-13 MED ORDER — MORPHINE SULFATE 4 MG/ML IJ SOLN: 4.0000 mg | Freq: Once | INTRAMUSCULAR | Status: DC

## 2013-01-13 MED ORDER — MORPHINE SULFATE 4 MG/ML IJ SOLN
4.0000 mg | Freq: Once | INTRAMUSCULAR | Status: AC
  Administered 2013-01-13: 4 mg via INTRAVENOUS
  Filled 2013-01-13: qty 1

## 2013-01-13 NOTE — ED Notes (Signed)
Pt denied medication for pain, wanting to wait until after CT scan.  Daughter at bedside.

## 2013-01-13 NOTE — ED Notes (Signed)
Patient transported to CT 

## 2013-01-13 NOTE — ED Notes (Signed)
Contacted CT about result of CT Angio. Scan currently being read. PA made aware.

## 2013-01-13 NOTE — ED Notes (Signed)
Patient transported to Ultrasound 

## 2013-01-13 NOTE — ED Notes (Signed)
CT phoned patient was ready for transport.

## 2013-01-13 NOTE — ED Notes (Signed)
Called radiology about results of CT Angio. Results given verbally by MD Grace Isaac, results then given to PA [facility]. Family and pt made aware of delay. Ultrasound

## 2013-01-13 NOTE — ED Provider Notes (Signed)
Medical screening examination/treatment/procedure(s) were conducted as a shared visit with non-physician practitioner(s) or resident and myself. I personally evaluated the patient during the encounter and agree with the findings and plan unless otherwise indicated.  I have personally reviewed any xrays and/ or EKG's with the provider and I agree with interpretation.  Recent surgery, pleuritic anterior chest pain since last evening. Lupus hx. Mild left leg pain. Concern for PE vs CAD vs other.  No diaphoresis. Exam mild MSK pain anterior chest, RRR, lungs clear, abd soft/ minimal RUQ tenderness/ no guarding, mild left LE tender posterior. Pt improved in ED. CT did not show PE however showed multiple findings- liver mass, pulmonary nodule, CAD. PA discussed at length details and discussed admission, pt prefers outpatient follow up at this time, understands this may be her heart with lupus/ cad on CT and age.   EKG Interpretation    Date/Time: Thursday January 13 2013 10:39:00 EST  Ventricular Rate: 92  PR Interval: 143  QRS Duration: 69  QT Interval: 348  QTC Calculation: 430  R Axis: 16  Text Interpretation: Sinus rhythm Anterior infarct, old No significant change was found Confirmed by Manus Gunning MD, STEPHEN ([id number]) on 01/13/2013 10:44:56 AM      Filed Vitals:    01/13/13 1146   BP:  144/73   Pulse:  87   Resp:  24      Enid Skeens, MD 01/13/13 1626

## 2013-01-13 NOTE — ED Notes (Signed)
Pt returned from Ultrasound.

## 2013-01-13 NOTE — ED Provider Notes (Signed)
CSN: [id number]     Arrival date & time 01/13/13  1031 History   First MD Initiated Contact with Patient 01/13/13 1035     Chief Complaint  Patient presents with  . Chest Pain   (Consider location/radiation/quality/duration/timing/severity/associated sxs/prior Treatment) HPI Patient presents with sudden onset of central chest pain around 7 or 8pm yesterday, pain is constant, exacerbated with deep inspiration.  Denies SOB, cough, fever, leg swelling.  Pt had a surgery to remove a toe last week, lasted 3 hours under general anesthesia.  Pt has hx DVT in left groin remotely.  Pt has hx lupus, RA, lung mass.  Denies trauma to the chest wall.    Past Medical History  Diagnosis Date  . Thyroid disease   . Lupus   . Arthritis     rheumatoid and osteoarthritis  . Chicken pox as a child  . Shingles   . Measles as a child  . Mumps as a child  . History of shingles 11/25/2010  . Lung mass   . Hypertension   . Hyperlipidemia   . CVA (cerebrovascular accident) june 2013   Past Surgical History  Procedure Laterality Date  . Abdominal hysterectomy      partial still has ovaries  . Tonsillectomy and adenoidectomy  as a child  . Removal of blockage  02-03-10  . Toe removal     Family History  Problem Relation Age of Onset  . Heart disease Mother   . Hyperlipidemia Mother   . Hypertension Mother   . Emphysema Father     smoked  . Heart attack Father     massive  . Heart disease Father     MI  . Other Son     shingles  . Cancer Paternal Grandmother    History  Substance Use Topics  . Smoking status: Former Smoker -- 2.00 packs/day for 15 years    Types: Cigarettes    Quit date: 02/03/1990  . Smokeless tobacco: Never Used  . Alcohol Use: No   OB History   Grav Para Term Preterm Abortions TAB SAB Ect Mult Living                 Review of Systems  Constitutional: Negative for fever.  Respiratory: Negative for cough and shortness of breath.   Cardiovascular: Positive for  chest pain.  Gastrointestinal: Negative for nausea, vomiting, abdominal pain and diarrhea.  Genitourinary: Negative for dysuria, urgency and frequency.    Allergies  Penicillins  Home Medications   Current Outpatient Rx  Name  Route  Sig  Dispense  Refill  . acetaminophen (TYLENOL) 325 MG tablet   Oral   Take 650 mg by mouth every 8 (eight) hours as needed for pain.         Marland Kitchen aspirin 325 MG EC tablet   Oral   Take 325 mg by mouth daily.         . calcium carbonate (OS-CAL) 600 MG TABS   Oral   Take 600 mg by mouth daily.           Marland Kitchen leflunomide (ARAVA) 20 MG tablet   Oral   Take 20 mg by mouth daily.         Marland Kitchen levothyroxine (SYNTHROID, LEVOTHROID) 75 MCG tablet   Oral   Take 1 tablet (75 mcg total) by mouth daily before breakfast.   90 tablet   3   . mirabegron ER (MYRBETRIQ) 25 MG TB24 tablet   Oral  Take 25 mg by mouth 2 (two) times daily.         Marland Kitchen olmesartan (BENICAR) 20 MG tablet   Oral   Take 20 mg by mouth daily.         Marland Kitchen EXPIRED: simvastatin (ZOCOR) 20 MG tablet   Oral   Take 1 tablet (20 mg total) by mouth every evening.   30 tablet   2   . telmisartan (MICARDIS) 40 MG tablet      Take 1 tablet by mouth  daily   120 tablet   3   . traMADol (ULTRAM) 50 MG tablet   Oral   Take 1 tablet (50 mg total) by mouth every 6 (six) hours as needed. For pain   50 tablet   5   . venlafaxine XR (EFFEXOR-XR) 75 MG 24 hr capsule   Oral   Take 1 capsule (75 mg total) by mouth daily.   90 capsule   3    SpO2 97% Physical Exam  Nursing note and vitals reviewed. Constitutional: She appears well-developed and well-nourished. No distress.  HENT:  Head: Normocephalic and atraumatic.  Neck: Neck supple.  Cardiovascular: Normal rate and regular rhythm.   Pulmonary/Chest: Effort normal and breath sounds normal. No respiratory distress. She has no wheezes. She has no rales. She exhibits tenderness.    Neurological: She is alert.  Skin: She is  not diaphoretic.    ED Course  Procedures (including critical care time) Labs Review Labs Reviewed  CBC WITH DIFFERENTIAL - Abnormal; Notable for the following:    Neutrophils Relative % 84 (*)    Lymphocytes Relative 9 (*)    All other components within normal limits  COMPREHENSIVE METABOLIC PANEL - Abnormal; Notable for the following:    Sodium 130 (*)    Chloride 93 (*)    Albumin 3.1 (*)    AST 83 (*)    ALT 57 (*)    Alkaline Phosphatase 247 (*)    All other components within normal limits  LIPASE, BLOOD  POCT I-STAT TROPONIN I   Imaging Review Ct Angio Chest Pe W/cm &/or Wo Cm  01/13/2013   CLINICAL DATA:  Anterior chest wall pain, evaluate for pulmonary embolism  EXAM: CT ANGIOGRAPHY CHEST WITH CONTRAST  TECHNIQUE: Multidetector CT imaging of the chest was performed using the standard protocol during bolus administration of intravenous contrast. Multiplanar CT image reconstructions including MIPs were obtained to evaluate the vascular anatomy.  CONTRAST:  OMNIPAQUE IOHEXOL 350 MG/ML SOLN  COMPARISON:  Chest CT -11/04/2012; 11/06/2011; PET-CT - 07/08/2011 ; CT abdomen pelvis - 02/25/2010  FINDINGS: Vascular Findings:  There is adequate opacification of the pulmonary arterial system with the main pulmonary artery measuring 427 Hounsfield units. There are no discrete filling defects within the pulmonary arterial tree to suggest pulmonary embolism. Normal caliber of the main pulmonary artery.  Normal heart size. Coronary artery calcifications. No pericardial effusion.  There is a moderate to large amount of irregular mixed calcified and noncalcified atherosclerotic plaque throughout the descending thoracic aorta beginning at the level of the aortic arch. No definite thoracic aortic dissection or periaortic stranding. Conventional configuration of the aortic arch. There is eccentric largely non calcified atherosclerotic plaque involving the origin of the left subclavian artery not  definitely resulting in hemodynamically significant stenosis.  Review of the MIP images confirms the above findings.   ----------------------------------------------------------------------------------  Nonvascular Findings:  Scattered bilateral pulmonary nodules are grossly unchanged including partially cavitary approximately 1.6 x 0.8  cm nodule within the left upper lobe (image 26, series 2 as well as the punctate (approximately 4 mm nodule within the superior segment of the right lower lobe (image 17, series 7). Unchanged approximately 0.9 cm nodule within the left lower lobe (image 30, series 7 an additional punctate (approximately 5 mm) nodule within the left lower lobe (image 50). There is persistent mild elevation of the right hemidiaphragm with associated right basilar atelectasis. There is minimal subsegmental ground-glass atelectasis within the left costophrenic angle. Grossly unchanged biapical pleural parenchymal thickening. No new focal airspace opacities. No pleural effusion or pneumothorax. The central pulmonary airways remain patent.  Borderline enlarged AP window lymph node, now measuring approximately 1.1 cm in greatest short axis diameter (image 39, series 5). No definite mediastinal hilar or axillary lymphadenopathy.  Limited early arterial phase evaluation of the upper abdomen suggests development of an approximately 2.8 x 2.7 cm lesion within the imaged dome of the right lobe of the liver (image 57, series 5), not definitely seen on prior examination. The gallbladder is again noted to extend about the anterior aspect of the right lobe of the liver.  No acute or aggressive osseous abnormalities. Mild-to-moderate multilevel cervical and lower thoracic spine DDD.  IMPRESSION: 1. No acute cardiopulmonary disease. Specifically, no evidence of pulmonary embolism 2. Coronary artery calcifications with extensive irregular mixed calcified and noncalcified atherosclerotic plaque involving the thoracic  aorta. 3. Bilateral pulmonary nodules, including the partially cavitary approximately 1.6 cm nodule within the left upper lobe are grossly unchanged since the 11/06/2011 examination. Continued follow-up to ensure at least 2 years of stability is recommended. 4. Nonspecific shotty, borderline enlarged AP window lymph node. Continued attention on follow-up is recommended. 5. Interval development of an apparent approximately 2.8 cm lesion within the dome of the right lobe of the liver, incompletely evaluated on the present examination. Further evaluation may be performed with either abdominal MRI or dedicated abdominal CT as clinically indicated. Critical Value/emergent results were called by telephone at the time of interpretation on 01/13/2013 at 2:55 PM to Dr. Harvel Quale, who verbally acknowledged these results.   Electronically Signed   By: Simonne Come M.D.   On: 01/13/2013 15:04   US Abdomen Complete  01/13/2013   CLINICAL DATA:  Right upper quadrant pain and tenderness. Elevated liver function tests.  EXAM: ULTRASOUND ABDOMEN COMPLETE  COMPARISON:  None.  FINDINGS: Gallbladder:  No gallstones or wall thickening visualized. No sonographic Murphy sign noted.  Common bile duct:  Diameter: 2 mm  Liver:  Two hypoechoic masses are seen which measure 4.2 cm and 1.7 cm in maximum diameter. These both have nonspecific characteristics.  IVC:  No abnormality visualized.  Pancreas:  Visualized portion unremarkable.  Spleen:  Size and appearance within normal limits.  Right Kidney:  Length: 10.3 cm. Echogenicity within normal limits. No mass or hydronephrosis visualized.  Left Kidney:  Length: 9.1 cm. Echogenicity within normal limits. No mass or hydronephrosis visualized.  Abdominal aorta:  Mild abdominal aortic aneurysm noted, measuring 3.1 cm in maximum diameter. Diffuse atherosclerotic plaque noted.  Other findings:  None.  IMPRESSION: Two hypoechoic liver masses, which are nonspecific. Liver metastases cannot be  excluded. Recommend further imaging characterization with abdomen MRI without and with contrast as the preferred exam. (CT would be appropriate if patient has contraindication to MRI or cannot cooperate with breath-holding.)  Mild abdominal aortic aneurysm measuring 3.1 cm. Recommend follow up by [place] in 3 years. This recommendation follows ACR consensus guidelines: White Paper of the  ACR Incidental Findings Committee II on Vascular Findings. Earlyne Iba [id number]; 56:213-086.   Electronically Signed   By: Myles Rosenthal M.D.   On: 01/13/2013 15:24    EKG Interpretation    Date/Time:  Thursday January 13 2013 10:39:00 EST Ventricular Rate:  92 PR Interval:  143 QRS Duration: 69 QT Interval:  348 QTC Calculation: 430 R Axis:   16 Text Interpretation:  Sinus rhythm Anterior infarct, old No significant change was found Confirmed by Manus Gunning  MD, STEPHEN [phone]) on 01/13/2013 10:44:56 AM           10:47 AM Discussed pt with Dr Jodi Mourning who will also see the patient.   1:43 PM Reexamination of the abdomen reveals RUQ tenderness.  Abdomen is nondistended, soft, no guarding, no rebound.  I have added US abdomen given RUQ tenderness and elevated LFTs.   Filed Vitals:   01/13/13 1500  BP: 121/61  Pulse: 85  Resp: 21     MDM   1. Pleuritic chest pain   2. Elevated LFTs   3. Liver masses   4. Pulmonary nodule   5. Atherosclerosis     Pt with hx recent surgery presents with acute onset central chest pain, stabbing, worse with deep inspiration.  CT angio chest negative for PE, shows continued multiple pulmonary lesions that are known to the patient and followed by thoracic surgery.  Pt also found to have new liver masses.  LFTs elevated.  Pt does have CAD on imaging but pain is atypical for cardiac pain. EKG is not ischemic and unchanged from prior. Troponin is negative approximately 20 hours after onset of constant symptoms.  VS are normal.  I offered admission to the patient and her family - I  had a long discussion with patient and family (with patient's permission) about all of the results, admission vs d/c home, the need for follow up for further evaluation of liver masses.  Pt and family prefer discharge home with pain medication and close PCP follow up.  Discussed result, findings, treatment, and follow up  with patient.  Pt given return precautions.  Pt verbalizes understanding and agrees with plan.           Trixie Dredge, PA-C 01/13/13 [phone]

## 2013-01-13 NOTE — ED Notes (Signed)
Per [facility] pt presents with c/o of anterior chest wall that started last night around 9pm.  Pt denies nausea, vomiting, diarrhea, fever and cough.  Per [facility], pt has diminished right lower lobe.  Pt c/o of mild bilateral headache.  BP 160/90, 90P, 97% and CBG 143.  Pt is alert, oriented and the chest pain only comes when she takes a breath.

## 2013-01-28 ENCOUNTER — Other Ambulatory Visit: Payer: Self-pay | Admitting: Internal Medicine

## 2013-02-10 ENCOUNTER — Other Ambulatory Visit: Payer: Self-pay | Admitting: Internal Medicine

## 2013-02-15 ENCOUNTER — Telehealth: Payer: Self-pay | Admitting: Internal Medicine

## 2013-02-15 NOTE — Telephone Encounter (Signed)
Pt is coming w/ husband for appt on wed  At 9:15 am and would like to know if she could get her labs done also? They would like to combine for one visit and pt last had labs in May. pls advise. Pt also states she had an episode in Dec , had to call ambulance. (thought it was heart attack)  Pt states hosp want her to get an mri.  Pt hasn't had time for post hosp fup. Pt has had 2 toes removed in Dec.pls advise

## 2013-02-15 NOTE — Telephone Encounter (Signed)
Appointment give for padonda tomorrow

## 2013-02-16 ENCOUNTER — Ambulatory Visit (INDEPENDENT_AMBULATORY_CARE_PROVIDER_SITE_OTHER): Payer: MEDICARE | Admitting: Family

## 2013-02-16 ENCOUNTER — Encounter: Payer: Self-pay | Admitting: Family

## 2013-02-16 VITALS — BP 128/84 | HR 102 | Wt 110.0 lb

## 2013-02-16 DIAGNOSIS — K769 Liver disease, unspecified: Secondary | ICD-10-CM

## 2013-02-16 DIAGNOSIS — R7989 Other specified abnormal findings of blood chemistry: Secondary | ICD-10-CM

## 2013-02-16 DIAGNOSIS — M069 Rheumatoid arthritis, unspecified: Secondary | ICD-10-CM

## 2013-02-16 DIAGNOSIS — R269 Unspecified abnormalities of gait and mobility: Secondary | ICD-10-CM

## 2013-02-16 DIAGNOSIS — R2681 Unsteadiness on feet: Secondary | ICD-10-CM

## 2013-02-16 DIAGNOSIS — M159 Polyosteoarthritis, unspecified: Secondary | ICD-10-CM

## 2013-02-16 DIAGNOSIS — I10 Essential (primary) hypertension: Secondary | ICD-10-CM

## 2013-02-16 DIAGNOSIS — R160 Hepatomegaly, not elsewhere classified: Secondary | ICD-10-CM

## 2013-02-16 DIAGNOSIS — Z9181 History of falling: Secondary | ICD-10-CM

## 2013-02-16 DIAGNOSIS — R945 Abnormal results of liver function studies: Secondary | ICD-10-CM

## 2013-02-16 LAB — HEPATIC FUNCTION PANEL
ALT: 16 U/L (ref 0–35)
AST: 24 U/L (ref 0–37)
Albumin: 3.3 g/dL — ABNORMAL LOW (ref 3.5–5.2)
Alkaline Phosphatase: 188 U/L — ABNORMAL HIGH (ref 39–117)
BILIRUBIN DIRECT: 0.3 mg/dL (ref 0.0–0.3)
TOTAL PROTEIN: 7 g/dL (ref 6.0–8.3)
Total Bilirubin: 1 mg/dL (ref 0.3–1.2)

## 2013-02-16 NOTE — Progress Notes (Signed)
Subjective:    Patient ID: Kristen Lee, female    DOB: [birth year], 73 y.o.   MRN: [id number]  HPI   73 year old white female, nonsmoker, patient of Dr. Arnoldo Morale presents today as a hospital followup from 01/13/2013. She presented to the emergency department with chest pain and subsequently diagnosed with pleurisy and found to have elevated liver function tests and a liver mass. She was advised to have an MRI of her liver for further evaluation. She denies any active chest pain, heartburn or indigestion. She has a history of hypothyroidism, rheumatoid arthritis, lupus, Sjogren syndrome. She is requesting a motorized wheelchair and scooter be due to decreased mobility. She is currently ambulating with a walker. Patient also had 2 toes amputated in December 2 and fourth toe on the left foot.  Review of Systems  Constitutional: Negative.   HENT: Negative.   Respiratory: Negative.   Cardiovascular: Negative.   Gastrointestinal: Negative for abdominal pain.       Liver mass  Endocrine: Negative.   Genitourinary: Negative.   Musculoskeletal: Positive for arthralgias, gait problem, joint swelling and myalgias.       Multiple joint pain and swelling. Ambulates with walker  Skin: Negative.   Neurological: Negative.   Psychiatric/Behavioral: Negative.    Past Medical History  Diagnosis Date  . Thyroid disease   . Lupus   . Arthritis     rheumatoid and osteoarthritis  . Chicken pox as a child  . Shingles   . Measles as a child  . Mumps as a child  . History of shingles 11/25/2010  . Lung mass   . Hypertension   . Hyperlipidemia   . CVA (cerebrovascular accident) june 2013    History   Social History  . Marital Status: Married    Spouse Name: N/A    Number of Children: N/A  . Years of Education: N/A   Occupational History  . Not on file.   Social History Main Topics  . Smoking status: Former Smoker -- 2.00 packs/day for 15 years    Types: Cigarettes    Quit date:  02/03/1990  . Smokeless tobacco: Never Used  . Alcohol Use: No  . Drug Use: No  . Sexual Activity: No   Other Topics Concern  . Not on file   Social History Narrative  . No narrative on file    Past Surgical History  Procedure Laterality Date  . Abdominal hysterectomy      partial still has ovaries  . Tonsillectomy and adenoidectomy  as a child  . Removal of blockage  02-03-10  . Toe removal      Family History  Problem Relation Age of Onset  . Heart disease Mother   . Hyperlipidemia Mother   . Hypertension Mother   . Emphysema Father     smoked  . Heart attack Father     massive  . Heart disease Father     MI  . Other Son     shingles  . Cancer Paternal Grandmother     Allergies  Allergen Reactions  . Penicillins Swelling    Current Outpatient Prescriptions on File Prior to Visit  Medication Sig Dispense Refill  . acetaminophen (TYLENOL) 325 MG tablet Take 325 mg by mouth daily.       Marland Kitchen acetaminophen (TYLENOL) 500 MG tablet Take 500 mg by mouth every 6 (six) hours as needed for moderate pain.      Marland Kitchen albuterol (PROVENTIL HFA;VENTOLIN HFA) 108 (90 BASE)  MCG/ACT inhaler Inhale into the lungs every 6 (six) hours as needed for wheezing or shortness of breath.      Marland Kitchen aspirin 325 MG EC tablet Take 325 mg by mouth daily.      . calcium carbonate (OS-CAL) 600 MG TABS Take 600 mg by mouth daily.        Marland Kitchen HYDROcodone-acetaminophen (NORCO/VICODIN) 5-325 MG per tablet Take 1 tablet by mouth every 4 (four) hours as needed (pain).  15 tablet  0  . leflunomide (ARAVA) 20 MG tablet Take 20 mg by mouth daily.      Marland Kitchen levothyroxine (SYNTHROID, LEVOTHROID) 75 MCG tablet Take 1 tablet (75 mcg total) by mouth daily before breakfast.  90 tablet  3  . mirabegron ER (MYRBETRIQ) 25 MG TB24 tablet Take 25 mg by mouth at bedtime.      Marland Kitchen olmesartan (BENICAR) 20 MG tablet Take 20 mg by mouth daily.      . simvastatin (ZOCOR) 20 MG tablet Take 20 mg by mouth daily.      Marland Kitchen telmisartan  (MICARDIS) 40 MG tablet Take 1 tablet by mouth  daily  90 tablet  3  . TOVIAZ 8 MG TB24 tablet Take 1 tablet by mouth  daily  120 each  1  . traMADol (ULTRAM) 50 MG tablet Take 1 tablet (50 mg total) by mouth every 6 (six) hours as needed. For pain  50 tablet  5  . venlafaxine XR (EFFEXOR-XR) 75 MG 24 hr capsule Take 1 capsule (75 mg total) by mouth daily.  90 capsule  3  . [DISCONTINUED] fesoterodine (TOVIAZ) 8 MG TB24 Take 1 tablet (8 mg total) by mouth daily.  90 tablet  1   No current facility-administered medications on file prior to visit.    BP 128/84  Pulse 102  Wt 110 lb (49.896 kg)chart    Objective:   Physical Exam  Constitutional: She is oriented to person, place, and time. She appears well-developed and well-nourished.  Neck: Normal range of motion. Neck supple.  Cardiovascular: Normal rate, regular rhythm and normal heart sounds.   Pulmonary/Chest: Effort normal and breath sounds normal.  Abdominal: Soft. Bowel sounds are normal.  Musculoskeletal: She exhibits edema and tenderness.  Swelling to the right knee. Contracture of the joints of the hands. Slow gait  Neurological: She is alert and oriented to person, place, and time.  Skin: Skin is warm and dry.  Psychiatric: She has a normal mood and affect.          Assessment & Plan:  Assessment: 1. Liver mass 2. Elevated liver function tests 3. Rheumatoid arthritis 4. Lupus  5. Osteoarthritis 6. GERD 7. Amputation toe x2  Plan: Order MRI of the liver for further evaluation of the liver mass. Order placed for motorized low chair and scooter. Reevaluate LFTs. followup pending results. Follow up with Dr. Arnoldo Morale as scheduled.

## 2013-02-16 NOTE — Patient Instructions (Signed)
Liver Panel A liver panel, also known as liver (hepatic) function tests or LFT, is used to detect liver damage or disease. One or more of these tests are ordered when symptoms suspicious of a liver condition are noticed. These include: jaundice, dark urine, or light-colored bowel movements; nausea, vomiting and/or diarrhea; loss of appetite; vomiting of blood; bloody or black bowel movements; swelling or pain in the belly; unusual weight change; or fatigue or loss of stamina. One or more of these tests may also be ordered when a person has been or may have been exposed to a hepatitis virus; has a family history of liver disease; has excessive alcohol intake; or is taking a drug that can cause liver damage. A liver panel usually includes 7 tests that are run at the same time on a blood sample. These include:  Alanine aminotransferase (ALT)  an enzyme mainly found in the liver; the best test for detecting hepatitis.  NORMAL FINDINGS  Elderly: may be slightly higher than adult values  Adult/child: 4-36 international units/L at 37 C or4-36 units/L (SI units)  Values may be higher in men and in African Americans.  Infant: may be twice as high as adult values. Alkaline phosphatase (ALP)  an enzyme related to the bile ducts; often increased when they are blocked NORMAL FINDINGS  Elderly: slightly higher than an adult.  Adult: 30-120 units/L or 0.5-2.0 microKat/L (SI units)  Child:/adolescent:  Less than 2 years: 85-235 units/L  2-8 Years: 65-210 units/L  9-15 years: 60-300 units/L  16-21 years: 30-200 units/L Aspartate aminotransferase (AST)  an enzyme found in the liver and a few other places, particularly the heart and other muscles in the body. NORMAL FINDINGS  Age / Normal value (units)  0-5 days / 35-140  Less than 3 yr / 15-60  3-6 yr / 15-50  6-12 yr / 10-50  12-18 yr / 10-40  Adult / 0-35 units/L or 0-0.58 microKat/L (SI units) (Females tend to have slightly lower than  males)  Elderly / Slightly higher than adults. Bilirubin  two different tests of bilirubin often used together (especially if a person has jaundice): total bilirubin measures all the bilirubin in the blood; direct bilirubin measures a form made in the liver.   Adult/elderly/child  Total bilirubin: 0.3-1.0 mg/dL or 5.1-17 micromole/L (SI units)  Indirect bilirubin: 0.2-0.8 mg/dL or 3.4-12.0 micromole/L (SI units)  Direct bilirubin: 0.1-0.3 mg/dL or 1.7-5.1 micromole/L (SI units)  Newborn total bilirubin:1.0-12.0 mg/dL or17.1-205 micromole/L (SI units)  Urine: 0.0-0.2 mg/dL Albumin  measures the main protein made by the liver and tells how well the liver is making this protein  Total Protein - measures albumin and all other proteins in blood, including antibodies made to help fight off infections.  NORMAL FINDINGS  Adult/Elderly  Total protein: 6.4-8.3 g/dL or 64-83 g/L (SI units)  Albumin: 3.5-5 g/dL or 35-50 g/L (SI units)  Globulin: 2.3-3.4 g/dL  Alpha1 globulin: 0.1-0.3 g/dL or 1-3 g/L (SI units)  Alpha2 globulin: 0.6-1 g/dL or 6-10 g/L (SI units)  Beta globulin: 0.7-1.1 g/dL or 7-11 g/L (SI units) Children  Total protein  Premature infant: 4.2-7.6 g/dL  Newborn: 4.6-7.4 g/dL  Infant: 6-6.7 g/dL  Child: 6.2-8 g/dL  Albumin  Premature infant: 3-4.2 g/dL  Newborn: 3.5-5.4 g/dL  Infant: 4.4-5.4 g/dL  Child: 4-5.9 g/dL Ranges for normal findings may vary among different laboratories and hospitals. You should always check with your doctor after having lab work or other tests done to discuss the meaning of your   test results and whether your values are considered within normal limits. MEANING OF TEST  Your caregiver will go over the test results with you and discuss the importance and meaning of your results, as well as treatment options and the need for additional tests if necessary. OBTAINING THE TEST RESULTS It is your responsibility to obtain your test results.  Ask the lab or department performing the test when and how you will get your results. Document Released: 02/15/2004 Document Revised: 04/14/2011 Document Reviewed: 01/03/2008 [employer] Patient Information 2014 [employer].  

## 2013-02-16 NOTE — Progress Notes (Signed)
Pre visit review using our clinic review tool, if applicable. No additional management support is needed unless otherwise documented below in the visit note. 

## 2013-02-28 ENCOUNTER — Ambulatory Visit
Admission: RE | Admit: 2013-02-28 | Discharge: 2013-02-28 | Disposition: A | Discharge status: Home / Self Care | Payer: MEDICARE | Source: Ambulatory Visit | Attending: Family | Admitting: Family

## 2013-02-28 DIAGNOSIS — R945 Abnormal results of liver function studies: Secondary | ICD-10-CM

## 2013-02-28 DIAGNOSIS — R7989 Other specified abnormal findings of blood chemistry: Secondary | ICD-10-CM

## 2013-02-28 DIAGNOSIS — R160 Hepatomegaly, not elsewhere classified: Secondary | ICD-10-CM

## 2013-02-28 MED ORDER — GADOBENATE DIMEGLUMINE 529 MG/ML IV SOLN
10.0000 mL | Freq: Once | INTRAVENOUS | Status: AC | PRN
  Administered 2013-02-28: 10 mL via INTRAVENOUS

## 2013-03-02 ENCOUNTER — Ambulatory Visit (INDEPENDENT_AMBULATORY_CARE_PROVIDER_SITE_OTHER): Payer: MEDICARE | Admitting: Family

## 2013-03-02 ENCOUNTER — Encounter: Payer: Self-pay | Admitting: Family

## 2013-03-02 VITALS — BP 126/72 | HR 107 | Wt 110.0 lb

## 2013-03-02 DIAGNOSIS — K219 Gastro-esophageal reflux disease without esophagitis: Secondary | ICD-10-CM

## 2013-03-02 DIAGNOSIS — F411 Generalized anxiety disorder: Secondary | ICD-10-CM

## 2013-03-02 DIAGNOSIS — C259 Malignant neoplasm of pancreas, unspecified: Secondary | ICD-10-CM

## 2013-03-02 DIAGNOSIS — C229 Malignant neoplasm of liver, not specified as primary or secondary: Secondary | ICD-10-CM

## 2013-03-02 MED ORDER — OMEPRAZOLE 40 MG PO CPDR: 40.0000 mg | DELAYED_RELEASE_CAPSULE | Freq: Every day | ORAL | Status: AC

## 2013-03-02 MED ORDER — DIAZEPAM 2 MG PO TABS: 2.0000 mg | ORAL_TABLET | Freq: Two times a day (BID) | ORAL | Status: AC | PRN

## 2013-03-02 NOTE — Progress Notes (Signed)
Subjective:    Patient ID: Kristen Lee, female    DOB: 1940/08/26, 73 y.o.   MRN: [id number]  HPI  73 year old white female is in today to discuss results of MRI. MRI showed pancreatic cancer with metastases to the liver. Reports have been heartburn and indigestion were now than before this typically relieved with TUMS.  Review of Systems  Constitutional: Positive for appetite change.       Decreased appetite  HENT: Negative.   Respiratory: Negative.   Cardiovascular: Negative.   Gastrointestinal: Negative.  Negative for abdominal pain.  Endocrine: Negative.   Genitourinary: Negative.   Musculoskeletal: Negative.   Skin: Negative.   Neurological: Negative.   Hematological: Negative.   Psychiatric/Behavioral: Negative.    Past Medical History  Diagnosis Date  . Thyroid disease   . Lupus   . Arthritis     rheumatoid and osteoarthritis  . Chicken pox as a child  . Shingles   . Measles as a child  . Mumps as a child  . History of shingles 11/25/2010  . Lung mass   . Hypertension   . Hyperlipidemia   . CVA (cerebrovascular accident) june 2013    History   Social History  . Marital Status: Married    Spouse Name: N/A    Number of Children: N/A  . Years of Education: N/A   Occupational History  . Not on file.   Social History Main Topics  . Smoking status: Former Smoker -- 2.00 packs/day for 15 years    Types: Cigarettes    Quit date: 02/03/1990  . Smokeless tobacco: Never Used  . Alcohol Use: No  . Drug Use: No  . Sexual Activity: No   Other Topics Concern  . Not on file   Social History Narrative  . No narrative on file    Past Surgical History  Procedure Laterality Date  . Abdominal hysterectomy      partial still has ovaries  . Tonsillectomy and adenoidectomy  as a child  . Removal of blockage  02-03-10  . Toe removal      Family History  Problem Relation Age of Onset  . Heart disease Mother   . Hyperlipidemia Mother   .  Hypertension Mother   . Emphysema Father     smoked  . Heart attack Father     massive  . Heart disease Father     MI  . Other Son     shingles  . Cancer Paternal Grandmother     Allergies  Allergen Reactions  . Penicillins Swelling    Current Outpatient Prescriptions on File Prior to Visit  Medication Sig Dispense Refill  . acetaminophen (TYLENOL) 325 MG tablet Take 325 mg by mouth daily.       Marland Kitchen acetaminophen (TYLENOL) 500 MG tablet Take 500 mg by mouth every 6 (six) hours as needed for moderate pain.      Marland Kitchen albuterol (PROVENTIL HFA;VENTOLIN HFA) 108 (90 BASE) MCG/ACT inhaler Inhale into the lungs every 6 (six) hours as needed for wheezing or shortness of breath.      Marland Kitchen aspirin 325 MG EC tablet Take 325 mg by mouth daily.      . calcium carbonate (OS-CAL) 600 MG TABS Take 600 mg by mouth daily.        Marland Kitchen HYDROcodone-acetaminophen (NORCO/VICODIN) 5-325 MG per tablet Take 1 tablet by mouth every 4 (four) hours as needed (pain).  15 tablet  0  . leflunomide (ARAVA) 20 MG  tablet Take 20 mg by mouth daily.      Marland Kitchen levothyroxine (SYNTHROID, LEVOTHROID) 75 MCG tablet Take 1 tablet (75 mcg total) by mouth daily before breakfast.  90 tablet  3  . mirabegron ER (MYRBETRIQ) 25 MG TB24 tablet Take 25 mg by mouth at bedtime.      Marland Kitchen olmesartan (BENICAR) 20 MG tablet Take 20 mg by mouth daily.      . simvastatin (ZOCOR) 20 MG tablet Take 20 mg by mouth daily.      Marland Kitchen telmisartan (MICARDIS) 40 MG tablet Take 1 tablet by mouth  daily  90 tablet  3  . TOVIAZ 8 MG TB24 tablet Take 1 tablet by mouth  daily  120 each  1  . traMADol (ULTRAM) 50 MG tablet Take 1 tablet (50 mg total) by mouth every 6 (six) hours as needed. For pain  50 tablet  5  . venlafaxine XR (EFFEXOR-XR) 75 MG 24 hr capsule Take 1 capsule (75 mg total) by mouth daily.  90 capsule  3  . [DISCONTINUED] fesoterodine (TOVIAZ) 8 MG TB24 Take 1 tablet (8 mg total) by mouth daily.  90 tablet  1   No current facility-administered medications  on file prior to visit.    BP 126/72  Pulse 107  Wt 110 lb (49.896 kg)chart     Objective:   Physical Exam  Constitutional: She is oriented to person, place, and time. She appears well-developed and well-nourished.  Neck: Normal range of motion. Neck supple.  Cardiovascular: Normal rate, regular rhythm and normal heart sounds.   Pulmonary/Chest: Effort normal and breath sounds normal.  Abdominal: Soft. Bowel sounds are normal.  Neurological: She is alert and oriented to person, place, and time.  Skin: Skin is warm and dry.  Psychiatric: She has a normal mood and affect.          Assessment & Plan:  Assessment: 1. Metastatic pancreatic cancer 2. Liver mass-liver cancer 3. GERD 4. Anxiety  Plan: Refer to oncology. Dr. Benay Spice will call tomorrow morning with an appointment for her. Valium 2 mg as needed for anxiety. Continue Effexor. Omeprazole 40 mg once daily for GERD.

## 2013-03-03 ENCOUNTER — Telehealth: Payer: Self-pay | Admitting: Oncology

## 2013-03-03 ENCOUNTER — Ambulatory Visit: Payer: MEDICARE | Admitting: Family

## 2013-03-03 NOTE — Telephone Encounter (Signed)
C/D 03/03/13 for appt. 03/10/13

## 2013-03-03 NOTE — Telephone Encounter (Signed)
NEW PATIENT SCHEDULED FOR 002/05 @ 10:30 W/DR. [place] DX-PANCREATIC CA WELCOME PACKET MAILED.

## 2013-03-04 ENCOUNTER — Other Ambulatory Visit: Payer: Self-pay | Admitting: Family

## 2013-03-04 ENCOUNTER — Other Ambulatory Visit: Payer: Self-pay | Admitting: Oncology

## 2013-03-04 DIAGNOSIS — R160 Hepatomegaly, not elsewhere classified: Secondary | ICD-10-CM

## 2013-03-04 DIAGNOSIS — C259 Malignant neoplasm of pancreas, unspecified: Secondary | ICD-10-CM

## 2013-03-04 NOTE — Addendum Note (Signed)
Addended by: Colleen Can on: 03/04/2013 03:11 PM   Modules accepted: Orders

## 2013-03-07 ENCOUNTER — Other Ambulatory Visit: Payer: Self-pay | Admitting: Radiology

## 2013-03-07 ENCOUNTER — Telehealth: Payer: Self-pay | Admitting: Internal Medicine

## 2013-03-07 ENCOUNTER — Other Ambulatory Visit: Payer: Self-pay | Admitting: Family

## 2013-03-07 DIAGNOSIS — R160 Hepatomegaly, not elsewhere classified: Secondary | ICD-10-CM

## 2013-03-07 NOTE — Telephone Encounter (Signed)
Carrie from radiology states pt's CT biopsy order needs to be changed to US Biopsy.  Once the change is made, she will call and schedule the pt.

## 2013-03-07 NOTE — Telephone Encounter (Signed)
done

## 2013-03-08 ENCOUNTER — Ambulatory Visit (HOSPITAL_COMMUNITY)
Admission: RE | Admit: 2013-03-08 | Discharge: 2013-03-08 | Disposition: A | Discharge status: Home / Self Care | Payer: MEDICARE | Source: Ambulatory Visit | Attending: Family | Admitting: Family

## 2013-03-08 ENCOUNTER — Other Ambulatory Visit: Payer: Self-pay | Admitting: Family

## 2013-03-08 ENCOUNTER — Encounter (HOSPITAL_COMMUNITY): Payer: Self-pay

## 2013-03-08 DIAGNOSIS — R160 Hepatomegaly, not elsewhere classified: Secondary | ICD-10-CM

## 2013-03-08 DIAGNOSIS — Z87891 Personal history of nicotine dependence: Secondary | ICD-10-CM | POA: Insufficient documentation

## 2013-03-08 DIAGNOSIS — M329 Systemic lupus erythematosus, unspecified: Secondary | ICD-10-CM | POA: Insufficient documentation

## 2013-03-08 DIAGNOSIS — C787 Secondary malignant neoplasm of liver and intrahepatic bile duct: Secondary | ICD-10-CM | POA: Insufficient documentation

## 2013-03-08 DIAGNOSIS — I10 Essential (primary) hypertension: Secondary | ICD-10-CM | POA: Insufficient documentation

## 2013-03-08 DIAGNOSIS — E785 Hyperlipidemia, unspecified: Secondary | ICD-10-CM | POA: Insufficient documentation

## 2013-03-08 DIAGNOSIS — K869 Disease of pancreas, unspecified: Secondary | ICD-10-CM | POA: Insufficient documentation

## 2013-03-08 DIAGNOSIS — Z8673 Personal history of transient ischemic attack (TIA), and cerebral infarction without residual deficits: Secondary | ICD-10-CM | POA: Insufficient documentation

## 2013-03-08 DIAGNOSIS — C801 Malignant (primary) neoplasm, unspecified: Secondary | ICD-10-CM | POA: Insufficient documentation

## 2013-03-08 LAB — CBC
HCT: 36.3 % (ref 36.0–46.0)
Hemoglobin: 12.4 g/dL (ref 12.0–15.0)
MCH: 33.9 pg (ref 26.0–34.0)
MCHC: 34.2 g/dL (ref 30.0–36.0)
MCV: 99.2 fL (ref 78.0–100.0)
PLATELETS: 160 10*3/uL (ref 150–400)
RBC: 3.66 MIL/uL — ABNORMAL LOW (ref 3.87–5.11)
RDW: 13 % (ref 11.5–15.5)
WBC: 8.7 10*3/uL (ref 4.0–10.5)

## 2013-03-08 LAB — PROTIME-INR
INR: 1.07 (ref 0.00–1.49)
Prothrombin Time: 13.7 seconds (ref 11.6–15.2)

## 2013-03-08 LAB — APTT: aPTT: 23 seconds — ABNORMAL LOW (ref 24–37)

## 2013-03-08 MED ORDER — HYDROCODONE-ACETAMINOPHEN 5-325 MG PO TABS: 1.0000 | ORAL_TABLET | ORAL | Status: DC | PRN

## 2013-03-08 MED ORDER — MIDAZOLAM HCL 2 MG/2ML IJ SOLN
INTRAMUSCULAR | Status: AC | PRN
  Administered 2013-03-08: 1 mg via INTRAVENOUS

## 2013-03-08 MED ORDER — FENTANYL CITRATE 0.05 MG/ML IJ SOLN
INTRAMUSCULAR | Status: AC
  Filled 2013-03-08: qty 4

## 2013-03-08 MED ORDER — FENTANYL CITRATE 0.05 MG/ML IJ SOLN
INTRAMUSCULAR | Status: AC | PRN
  Administered 2013-03-08 (×2): 25 ug via INTRAVENOUS

## 2013-03-08 MED ORDER — MIDAZOLAM HCL 2 MG/2ML IJ SOLN
INTRAMUSCULAR | Status: AC
  Filled 2013-03-08: qty 4

## 2013-03-08 MED ORDER — SODIUM CHLORIDE 0.9 % IV SOLN
INTRAVENOUS | Status: DC
  Administered 2013-03-08: 14:00:00 via INTRAVENOUS

## 2013-03-08 NOTE — Procedures (Signed)
Interventional Radiology Procedure Note  Procedure: US guided bx right liver lesion.  Complications: None Recommendations: - Bedrest x 3 hrs - Path pending  Signed,  Criselda Peaches, MD [facility]

## 2013-03-08 NOTE — H&P (Signed)
Chief Complaint: "I am here for a liver biopsy." Referring Physician: Roxy Cedar NP HPI: Kristen Lee is an 73 y.o. female who presents today for a liver mass biopsy. She had a MRI on 02/28/13 for pain and elevated LFT's which revealed a centrally necrotic mass in the tail the pancreas with extensive enhancing hepatic lesions, favoring metastatic pancreatic adenocarcinoma. She denies any chest pain or shortness of breath today, she does admit to epigastric region tenderness. She denies any fever or chills. She denies any active bleeding. She denies any previous complications with conscious sedation during a colonoscopy. She denies any history of sleep apnea or chronic oxygen use.   Past Medical History:  Past Medical History  Diagnosis Date  . Thyroid disease   . Lupus   . Arthritis     rheumatoid and osteoarthritis  . Chicken pox as a child  . Shingles   . Measles as a child  . Mumps as a child  . History of shingles 11/25/2010  . Lung mass   . Hypertension   . Hyperlipidemia   . CVA (cerebrovascular accident) june 2013    Past Surgical History:  Past Surgical History  Procedure Laterality Date  . Abdominal hysterectomy      partial still has ovaries  . Tonsillectomy and adenoidectomy  as a child  . Removal of blockage  02-03-10  . Toe removal      Family History:  Family History  Problem Relation Age of Onset  . Heart disease Mother   . Hyperlipidemia Mother   . Hypertension Mother   . Emphysema Father     smoked  . Heart attack Father     massive  . Heart disease Father     MI  . Other Son     shingles  . Cancer Paternal Grandmother     Social History:  reports that she quit smoking about 23 years ago. Her smoking use included Cigarettes. She has a 30 pack-year smoking history. She has never used smokeless tobacco. She reports that she does not drink alcohol or use illicit drugs.  Allergies:  Allergies  Allergen Reactions  . Penicillins Swelling     Medications:   Medication List    ASK your doctor about these medications       acetaminophen 325 MG tablet  Commonly known as:  TYLENOL  Take 325 mg by mouth daily.     acetaminophen 500 MG tablet  Commonly known as:  TYLENOL  Take 500 mg by mouth every 6 (six) hours as needed for moderate pain.     albuterol 108 (90 BASE) MCG/ACT inhaler  Commonly known as:  PROVENTIL HFA;VENTOLIN HFA  Inhale into the lungs every 6 (six) hours as needed for wheezing or shortness of breath.     aspirin 325 MG EC tablet  Take 325 mg by mouth daily.     calcium carbonate 600 MG Tabs tablet  Commonly known as:  OS-CAL  Take 600 mg by mouth daily.     diazepam 2 MG tablet  Commonly known as:  VALIUM  Take 1 tablet (2 mg total) by mouth every 12 (twelve) hours as needed for anxiety.     HYDROcodone-acetaminophen 5-325 MG per tablet  Commonly known as:  NORCO/VICODIN  Take 1 tablet by mouth every 4 (four) hours as needed (pain).     leflunomide 20 MG tablet  Commonly known as:  ARAVA  Take 20 mg by mouth daily.     levothyroxine  75 MCG tablet  Commonly known as:  SYNTHROID, LEVOTHROID  Take 1 tablet (75 mcg total) by mouth daily before breakfast.     MYRBETRIQ 25 MG Tb24 tablet  Generic drug:  mirabegron ER  Take 25 mg by mouth at bedtime.     olmesartan 20 MG tablet  Commonly known as:  BENICAR  Take 20 mg by mouth daily.     omeprazole 40 MG capsule  Commonly known as:  PRILOSEC  Take 1 capsule (40 mg total) by mouth daily.     simvastatin 20 MG tablet  Commonly known as:  ZOCOR  Take 20 mg by mouth daily.     telmisartan 40 MG tablet  Commonly known as:  MICARDIS  Take 1 tablet by mouth  daily     TOVIAZ 8 MG Tb24 tablet  Generic drug:  fesoterodine  Take 1 tablet by mouth  daily     traMADol 50 MG tablet  Commonly known as:  ULTRAM  Take 1 tablet (50 mg total) by mouth every 6 (six) hours as needed. For pain     venlafaxine XR 75 MG 24 hr capsule  Commonly  known as:  EFFEXOR-XR  Take 1 capsule (75 mg total) by mouth daily.       Please HPI for pertinent positives, otherwise complete 10 system ROS negative.  Physical Exam: BP 153/91  Pulse 89  Temp(Src) 97.3 F (36.3 C) (Oral)  Resp 20  Ht 4\' 11"  (1.499 m)  Wt 107 lb (48.535 kg)  BMI 21.60 kg/m2  SpO2 98% Body mass index is 21.6 kg/(m^2).  General Appearance:  Alert, cooperative, no distress  Head:  Normocephalic, without obvious abnormality, atraumatic  Neck: Supple, symmetrical, trachea midline  Lungs:   Clear to auscultation bilaterally, no w/r/r, respirations unlabored without use of accessory muscles.  Chest Wall:  No tenderness or deformity  Heart:  Regular rate and rhythm, S1, S2 normal, no murmur, rub or gallop.  Abdomen:   Soft, epigastric tenderness, non distended, (+) BS  Extremities: Extremities normal, atraumatic, no cyanosis or edema  Pulses: 1+ and symmetric  Neurologic: Normal affect, no gross deficits.   Results for orders placed during the hospital encounter of 03/08/13 (from the past 48 hour(s))  APTT     Status: Abnormal   Collection Time    03/08/13 12:59 PM      Result Value Range   aPTT 23 (*) 24 - 37 seconds  CBC     Status: Abnormal   Collection Time    03/08/13 12:59 PM      Result Value Range   WBC 8.7  4.0 - 10.5 K/uL   RBC 3.66 (*) 3.87 - 5.11 MIL/uL   Hemoglobin 12.4  12.0 - 15.0 g/dL   HCT 36.3  36.0 - 46.0 %   MCV 99.2  78.0 - 100.0 fL   MCH 33.9  26.0 - 34.0 pg   MCHC 34.2  30.0 - 36.0 g/dL   RDW 13.0  11.5 - 15.5 %   Platelets 160  150 - 400 K/uL  PROTIME-INR     Status: None   Collection Time    03/08/13 12:59 PM      Result Value Range   Prothrombin Time 13.7  11.6 - 15.2 seconds   INR 1.07  0.00 - 1.49   No results found.  Assessment/Plan Liver mass. MRI 02/28/13 Request for image guided liver mass biopsy. Patient has been NPO, labs and images reviewed. Risks and Benefits  discussed with the patient. All of the patient's  questions were answered, patient is agreeable to proceed. Consent signed and in chart.  Tsosie Billing D PA-C 03/08/2013, 2:13 PM

## 2013-03-08 NOTE — Discharge Instructions (Signed)
Liver Biopsy  Care After  These instructions give you information on caring for yourself after your procedure. Your doctor may also give you more specific instructions. Call your doctor if you have any problems or questions after your procedure.  HOME CARE  · Watch for bleeding at your biopsy site.  · No heavy lifting, pushing, or pulling for 48 hours (2 days).  · No exercise, jogging, or sex for 48 hours (2 days).  · Do not drive or use heavy machinery for 24 hours (1 day).  · Go back to your usual diet and medicines as told by your doctor.  · Do not take the bandage off until the next morning.  · Only take medicine as told by your doctor.  · Do not shower or bathe until the next day.  GET HELP RIGHT AWAY IF:  · You have shortness of breath or trouble breathing.  · You have pain or cramping in your belly (abdomen).  · You feel sick to your stomach (nauseous) or throw up (vomit).  · Bleeding does not stop from the place where the needle was put in. Press on the place that is bleeding until you are checked in the Emergency Room.  · Yellowish white fluid (pus) is coming from the place where the needle was put in.  · You have any unusual pain that will not stop.  · You have puffiness (swelling) or redness at the place where the needle was put in, or if the place is very sore or hot when you touch it.  · You have a fever of more than 102° F (38.9° C) for 2 or more days.  · You have black, smelly poops (bowel movements).  If you go to the Emergency Room, tell the nurse that you had a liver biopsy. Take this paper with you and show it to the nurse. Keep your follow-up appointment.  MAKE SURE YOU:  · Understand these instructions.  · Will watch your condition.  · Will get help right away if you are not doing well or get worse.  Document Released: 10/30/2007 Document Revised: 04/14/2011 Document Reviewed: 10/30/2007  [employer]® Patient Information ©2014 [employer].

## 2013-03-09 ENCOUNTER — Telehealth: Payer: Self-pay | Admitting: Internal Medicine

## 2013-03-09 NOTE — Telephone Encounter (Signed)
Relevant patient education mailed to patient.  

## 2013-03-10 ENCOUNTER — Other Ambulatory Visit (HOSPITAL_BASED_OUTPATIENT_CLINIC_OR_DEPARTMENT_OTHER): Payer: MEDICARE

## 2013-03-10 ENCOUNTER — Telehealth: Payer: Self-pay | Admitting: Oncology

## 2013-03-10 ENCOUNTER — Ambulatory Visit (HOSPITAL_BASED_OUTPATIENT_CLINIC_OR_DEPARTMENT_OTHER): Payer: MEDICARE | Admitting: Oncology

## 2013-03-10 ENCOUNTER — Encounter: Payer: Self-pay | Admitting: Oncology

## 2013-03-10 ENCOUNTER — Ambulatory Visit: Payer: MEDICARE

## 2013-03-10 VITALS — BP 144/77 | HR 98 | Temp 96.8°F | Resp 18 | Ht 59.0 in | Wt 105.1 lb

## 2013-03-10 DIAGNOSIS — D490 Neoplasm of unspecified behavior of digestive system: Secondary | ICD-10-CM

## 2013-03-10 DIAGNOSIS — K7689 Other specified diseases of liver: Secondary | ICD-10-CM

## 2013-03-10 DIAGNOSIS — C259 Malignant neoplasm of pancreas, unspecified: Secondary | ICD-10-CM

## 2013-03-10 DIAGNOSIS — K5900 Constipation, unspecified: Secondary | ICD-10-CM

## 2013-03-10 LAB — COMPREHENSIVE METABOLIC PANEL (CC13)
ALT: 37 U/L (ref 0–55)
ANION GAP: 9 meq/L (ref 3–11)
AST: 47 U/L — ABNORMAL HIGH (ref 5–34)
Albumin: 2.7 g/dL — ABNORMAL LOW (ref 3.5–5.0)
Alkaline Phosphatase: 276 U/L — ABNORMAL HIGH (ref 40–150)
BILIRUBIN TOTAL: 0.96 mg/dL (ref 0.20–1.20)
BUN: 12.1 mg/dL (ref 7.0–26.0)
CO2: 27 mEq/L (ref 22–29)
CREATININE: 0.7 mg/dL (ref 0.6–1.1)
Calcium: 9 mg/dL (ref 8.4–10.4)
Chloride: 101 mEq/L (ref 98–109)
GLUCOSE: 109 mg/dL (ref 70–140)
Potassium: 3.7 mEq/L (ref 3.5–5.1)
SODIUM: 136 meq/L (ref 136–145)
Total Protein: 5.9 g/dL — ABNORMAL LOW (ref 6.4–8.3)

## 2013-03-10 LAB — CBC WITH DIFFERENTIAL/PLATELET
BASO%: 0.6 % (ref 0.0–2.0)
Basophils Absolute: 0.1 10*3/uL (ref 0.0–0.1)
EOS ABS: 0.2 10*3/uL (ref 0.0–0.5)
EOS%: 2.3 % (ref 0.0–7.0)
HEMATOCRIT: 36.4 % (ref 34.8–46.6)
HGB: 12.1 g/dL (ref 11.6–15.9)
LYMPH%: 7.8 % — AB (ref 14.0–49.7)
MCH: 33.6 pg (ref 25.1–34.0)
MCHC: 33.2 g/dL (ref 31.5–36.0)
MCV: 101.3 fL — AB (ref 79.5–101.0)
MONO#: 0.7 10*3/uL (ref 0.1–0.9)
MONO%: 8.2 % (ref 0.0–14.0)
NEUT%: 81.1 % — ABNORMAL HIGH (ref 38.4–76.8)
NEUTROS ABS: 6.7 10*3/uL — AB (ref 1.5–6.5)
PLATELETS: 144 10*3/uL — AB (ref 145–400)
RBC: 3.6 10*6/uL — ABNORMAL LOW (ref 3.70–5.45)
RDW: 13.1 % (ref 11.2–14.5)
WBC: 8.3 10*3/uL (ref 3.9–10.3)
lymph#: 0.7 10*3/uL — ABNORMAL LOW (ref 0.9–3.3)

## 2013-03-10 NOTE — Telephone Encounter (Signed)
gv adn printed appt sched and avs for pt for March.... °

## 2013-03-10 NOTE — Progress Notes (Signed)
Please see consult note.  

## 2013-03-10 NOTE — Progress Notes (Signed)
Checked in new pt with no financial concerns. °

## 2013-03-10 NOTE — Consult Note (Signed)
Reason for Referral: Pancreatic cancer.   HPI: 73 year old woman currently of [place] where she lived the majority of her life. She has a past medical history significant for autoimmune arthritis as well as multiple comorbid conditions quitting hypertension and CVA. She is very limited performance status due  debilitating arthritis.  She been monitored with repeat pulmonary imaging for pulmonary nodules. On 01/13/2013 she presented with anterior chest wall pain and underwent CT scan with angiography which failed to show any pulmonary embolism but showed bilateral pulmonary nodules including partially cavitary lesion at 1.6 cm this was grossly unchanged. There is also interval development of an apparent 2.8 cm lesion in the dome of the right liver. Based on these findings, she underwent an MRI of the abdomen which showed centrally necrotic mass in the tail of the pancreas with extensive enhancing hepatic lesions appear the pancreatic mass measures 2.3 x 3.9 cm. Numerous hepatic lesions were identified at this time. She underwent an ultrasound-guided biopsy on 03/08/2013 and she was referred to me for further evaluation. Clinically, Kristen Lee have been doing poorly. She has continued to lose weight close to 20 pounds in having pain issues predominately in the mid epigastric area not radiating and associated with any nausea or vomiting. She does have chronic back pain associated with arthritis but has not changed. She has not reported any hematochezia or melena. She has not reported any clinical or stools but did report constipation. Her performance status have been very limited.   Past Medical History  Diagnosis Date  . Thyroid disease   . Lupus   . Arthritis     rheumatoid and osteoarthritis  . Chicken pox as a child  . Shingles   . Measles as a child  . Mumps as a child  . History of shingles 11/25/2010  . Lung mass   . Hypertension   . Hyperlipidemia   . CVA (cerebrovascular  accident) june 2013  :  Past Surgical History  Procedure Laterality Date  . Abdominal hysterectomy      partial still has ovaries  . Tonsillectomy and adenoidectomy  as a child  . Removal of blockage  02-03-10  . Toe removal    :  Current Outpatient Prescriptions  Medication Sig Dispense Refill  . acetaminophen (TYLENOL) 325 MG tablet Take 325 mg by mouth daily.       Marland Kitchen acetaminophen (TYLENOL) 500 MG tablet Take 500 mg by mouth every 6 (six) hours as needed for moderate pain.      Marland Kitchen albuterol (PROVENTIL HFA;VENTOLIN HFA) 108 (90 BASE) MCG/ACT inhaler Inhale into the lungs every 6 (six) hours as needed for wheezing or shortness of breath.      Marland Kitchen aspirin 325 MG EC tablet Take 325 mg by mouth daily.      . calcium carbonate (OS-CAL) 600 MG TABS Take 600 mg by mouth daily.        . diazepam (VALIUM) 2 MG tablet Take 1 tablet (2 mg total) by mouth every 12 (twelve) hours as needed for anxiety.  20 tablet  0  . HYDROcodone-acetaminophen (NORCO/VICODIN) 5-325 MG per tablet Take 1 tablet by mouth every 4 (four) hours as needed (pain).  15 tablet  0  . leflunomide (ARAVA) 20 MG tablet Take 20 mg by mouth daily.      Marland Kitchen levothyroxine (SYNTHROID, LEVOTHROID) 75 MCG tablet Take 1 tablet (75 mcg total) by mouth daily before breakfast.  90 tablet  3  . mirabegron ER (MYRBETRIQ) 25  MG TB24 tablet Take 25 mg by mouth at bedtime.      Marland Kitchen olmesartan (BENICAR) 20 MG tablet Take 20 mg by mouth daily.      Marland Kitchen omeprazole (PRILOSEC) 40 MG capsule Take 1 capsule (40 mg total) by mouth daily.  30 capsule  3  . simvastatin (ZOCOR) 20 MG tablet Take 20 mg by mouth daily.      Marland Kitchen telmisartan (MICARDIS) 40 MG tablet Take 1 tablet by mouth  daily  90 tablet  3  . TOVIAZ 8 MG TB24 tablet Take 1 tablet by mouth  daily  120 each  1  . traMADol (ULTRAM) 50 MG tablet Take 1 tablet (50 mg total) by mouth every 6 (six) hours as needed. For pain  50 tablet  5  . venlafaxine XR (EFFEXOR-XR) 75 MG 24 hr capsule Take 1 capsule (75  mg total) by mouth daily.  90 capsule  3  . [DISCONTINUED] fesoterodine (TOVIAZ) 8 MG TB24 Take 1 tablet (8 mg total) by mouth daily.  90 tablet  1   No current facility-administered medications for this visit.       Allergies  Allergen Reactions  . Penicillins Swelling and Anaphylaxis  :  Family History  Problem Relation Age of Onset  . Heart disease Mother   . Hyperlipidemia Mother   . Hypertension Mother   . Emphysema Father     smoked  . Heart attack Father     massive  . Heart disease Father     MI  . Other Son     shingles  . Cancer Paternal Grandmother   :  History   Social History  . Marital Status: Married    Spouse Name: N/A    Number of Children: N/A  . Years of Education: N/A   Occupational History  . Not on file.   Social History Main Topics  . Smoking status: Former Smoker -- 2.00 packs/day for 15 years    Types: Cigarettes    Quit date: 02/03/1990  . Smokeless tobacco: Never Used  . Alcohol Use: No  . Drug Use: No  . Sexual Activity: No   Other Topics Concern  . Not on file   Social History Narrative  . No narrative on file  :  Constitutional: negative for chills, fevers and night sweats Eyes: negative for icterus and irritation Ears, nose, mouth, throat, and face: negative for sore mouth and sore throat Respiratory: negative for cough, hemoptysis and pneumonia Cardiovascular: negative for chest pain, lower extremity edema and orthopnea Gastrointestinal: negative except for abdominal pain and constipation Genitourinary:negative for hematuria and nocturia Integument/breast: negative for pruritus, rash and skin color change Hematologic/lymphatic: negative for bleeding, easy bruising and lymphadenopathy Musculoskeletal:negative for muscle weakness Neurological: negative for coordination problems and vertigo Behavioral/Psych: negative for anxiety and depression Endocrine: negative for temperature intolerance Allergic/Immunologic:  negative for anaphylaxis  Exam:  ECOG 2 Blood pressure 144/77, pulse 98, temperature 96.8 F (36 C), temperature source Oral, resp. rate 18, height 4\' 11"  (1.499 m), weight 105 lb 1.6 oz (47.673 kg). General appearance: alert, cooperative and appears stated age Head: Normocephalic, without obvious abnormality, atraumatic Eyes: conjunctivae/corneas clear. PERRL, EOM's intact. Fundi benign. Nose: Nares normal. Septum midline. Mucosa normal. No drainage or sinus tenderness. Throat: lips, mucosa, and tongue normal; teeth and gums normal Neck: no adenopathy, no carotid bruit, no JVD, supple, symmetrical, trachea midline and thyroid not enlarged, symmetric, no tenderness/mass/nodules Back: symmetric, no curvature. ROM normal. No CVA tenderness. Chest  wall: no tenderness Cardio: regular rate and rhythm, S1, S2 normal, no murmur, click, rub or gallop GI: soft, non-tender; bowel sounds normal; no masses,  no organomegaly Pelvic: cervix normal in appearance, external genitalia normal, no adnexal masses or tenderness, no cervical motion tenderness, rectovaginal septum normal, uterus normal size, shape, and consistency and vagina normal without discharge Extremities: extremities normal, atraumatic, no cyanosis or edema Pulses: 2+ and symmetric Skin: Skin color, texture, turgor normal. No rashes or lesions Lymph nodes: Cervical, supraclavicular, and axillary nodes normal. Neurologic: Grossly normal   Recent Labs  03/08/13 1259 03/10/13 1034  WBC 8.7 8.3  HGB 12.4 12.1  HCT 36.3 36.4  PLT 160 144*    Recent Labs  03/10/13 1034  NA 136  K 3.7  CO2 27  GLUCOSE 109  BUN 12.1  CREATININE 0.7  CALCIUM 9.0     Mr Liver W Wo Contrast  03/01/2013   CLINICAL DATA:  Right hepatic lobe mass with elevated liver function tests.  EXAM: MRI ABDOMEN WITHOUT AND WITH CONTRAST  TECHNIQUE: Multiplanar, multisequence MR imaging was performed both before and after administration of intravenous contrast.   CONTRAST:  74mL MULTIHANCE GADOBENATE DIMEGLUMINE 529 MG/ML IV SOLN  BUN and creatinine were obtained on site at [facility] at  [address]  Results:  BUN 13 mg/dL,  Creatinine 0.7 mg/dL.  COMPARISON:  US ABDOMEN COMPLETE dated 01/13/2013; NM PET IMAGE INITIAL (PI) SKULL BASE TO THIGH dated 07/08/2011; CT ABD/PELVIS W CM dated 02/25/2010; CT ANGIO CHEST W/CM &/OR WO/CM dated 01/13/2013  FINDINGS: Unfortunately there is a 2.3 x 3.9 cm centrally necrotic mass in the tail the pancreas associated with numerous masses scattered in all segments of the liver, demonstrating thick irregular target or rim enhancement favoring diffuse hepatic metastatic disease. All of these are new compared to the prior PET-CT of 07/08/2011. Many of these have a slightly low T1 signal intensity rim and internal high T2 signal intensity. Index lesion in hepatic segment 7 measures 4.1 x 4.0 cm on image 12 of series 13.  The portal vein is patent. Elevated right hemidiaphragm. Mild perihepatic ascites with the gallbladder extending anterior to the right hepatic lobe adjacent to the diaphragm. No vascular encasement by tumor. No overt porta hepatis adenopathy is currently seen.  The visualized upper poles of the kidneys appear unremarkable.  No biliary dilatation.  There is mural thrombus in the distal thoracic and upper abdominal aorta. 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 I suspect that both right and left hepatic arterial branches arise from the celiac although admittedly the vessels are small and difficult to characterize. Stenotic proximal renal arteries.  Levoconvex lumbar scoliosis.  IMPRESSION: 1. Centrally necrotic mass in the tail the pancreas with extensive enhancing hepatic lesions, favoring metastatic pancreatic adenocarcinoma. No definite porta hepatis adenopathy and no vascular invasion is currently identified, but all segments of the liver are affected by tumor. Tissue diagnosis  recommended. 2. Mild perihepatic ascites -difficult to exclude malignant ascites. 3. Elevated right hemidiaphragm. 4. 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 5. Levoconvex scoliosis.   Electronically Signed   By: Sherryl Barters M.D.   On: 03/01/2013 17:01     Assessment and Plan:   73 year old woman presented with:  1.  Tail of the pancreas tumor with metastatic liver lesions. She is status post a biopsy of the liver which is currently pending. The differential diagnosis was discussed today with the patient and her family.  Most likely this represents metastatic adenocarcinoma of the pancreas with disease to the liver. This could also be neuroendocrine tumor and certainly the pathology will evaluate for that. Her clinical presentation favor adenocarcinoma but still the pathology is complete we cannot assume anything. I discussed in detail the treatment options for both cancers. If this is adenocarcinoma this represents a rather grim prognosis associated with very limited life expectancy. Options include best supportive care versus systemic chemotherapy was discussed today in detail. Palliative chemotherapy with Gemzar was discussed today extensively putting risks and benefits and the potential advantage of palliation of pain and improving quality of life. Patient declined systemic chemotherapy and would prefer supportive care if that's what we are dealing with. We will await the results of the pathology and I will communicate that to her son Kristen Lee [phone].  2. Pain management: Seems to be her pain is reasonably controlled for the time being but this could be become an issue down the line and we will assist in her pain management as needed.  3. Constipation: She will require a aggressive bowel regimen as well. We will continue to assess that in future visits.  4. Prognosis: Depending on the pathology of this is adenocarcinoma it very limited expectancy  and I recommended hospice referral if that is the case.

## 2013-03-11 ENCOUNTER — Telehealth: Payer: Self-pay | Admitting: *Deleted

## 2013-03-11 ENCOUNTER — Encounter: Payer: Self-pay | Admitting: *Deleted

## 2013-03-11 LAB — CANCER ANTIGEN 19-9: CA 19-9: 11509.9 U/mL — ABNORMAL HIGH (ref <35.0)

## 2013-03-11 NOTE — Progress Notes (Signed)
Faxed copies of last o.v. Note, med list, biopsy report and demographics to [facility] [phone]

## 2013-03-11 NOTE — Telephone Encounter (Signed)
Patient referred to [facility], dr Alen Blew to be the attending, they may do symptom management and have DNR signed.

## 2013-03-17 ENCOUNTER — Ambulatory Visit: Payer: MEDICARE | Admitting: Family

## 2013-03-17 ENCOUNTER — Telehealth: Payer: Self-pay | Admitting: Medical Oncology

## 2013-03-17 NOTE — Telephone Encounter (Signed)
Certification form fax received from [facility] For Dr Alen Blew to sign.   Faxed the MD signed documents to hospice.

## 2013-03-21 ENCOUNTER — Encounter: Payer: Self-pay | Admitting: Internal Medicine

## 2013-04-04 ENCOUNTER — Telehealth: Payer: Self-pay | Admitting: Oncology

## 2013-04-04 NOTE — Telephone Encounter (Signed)
Pt daughter called pt is now hospice appt cancelled,MD aware of appt

## 2013-04-05 ENCOUNTER — Ambulatory Visit: Payer: MEDICARE | Admitting: Oncology

## 2013-04-08 ENCOUNTER — Other Ambulatory Visit: Payer: Self-pay

## 2013-04-08 DIAGNOSIS — C259 Malignant neoplasm of pancreas, unspecified: Secondary | ICD-10-CM

## 2013-04-08 MED ORDER — SENNOSIDES-DOCUSATE SODIUM 8.6-50 MG PO TABS: 1.0000 | ORAL_TABLET | Freq: Two times a day (BID) | ORAL | Status: DC

## 2013-04-08 MED ORDER — SENNOSIDES-DOCUSATE SODIUM 8.6-50 MG PO TABS
1.0000 | ORAL_TABLET | Freq: Two times a day (BID) | ORAL | Status: AC
Start: 2013-04-08 — End: ?

## 2013-05-04 DEATH — deceased

## 2013-05-22 IMAGING — CT CT CHEST W/O CM
3 of 4 series · 16 of 30 positions shown, 17 images · non-contrast
Comparison: PET CT 07/08/2011; chest CT 06/24/2011

CLINICAL DATA: Through both follow up pulmonary nodules

CT CHEST WITHOUT CONTRAST
TECHNIQUE: Multidetector CT imaging of the chest was performed
following the standard protocol without IV contrast.

[Series 3: chest w/o · axial · non-contrast · 0.70mm/px · z∈[-171,-1]mm · 4 of 58 slices shown, 5 images]
[im 12/58  mediastinal]
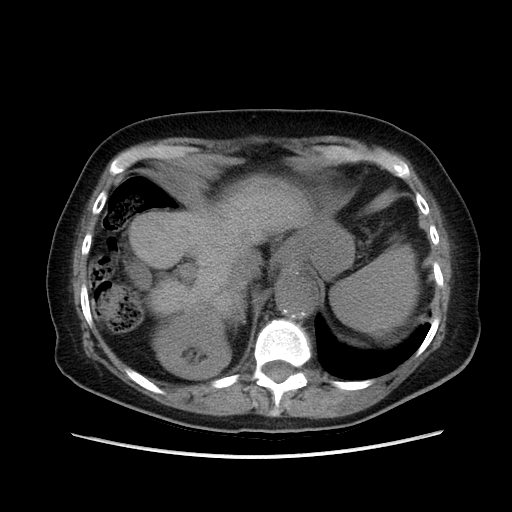
[im 12/58  lung]
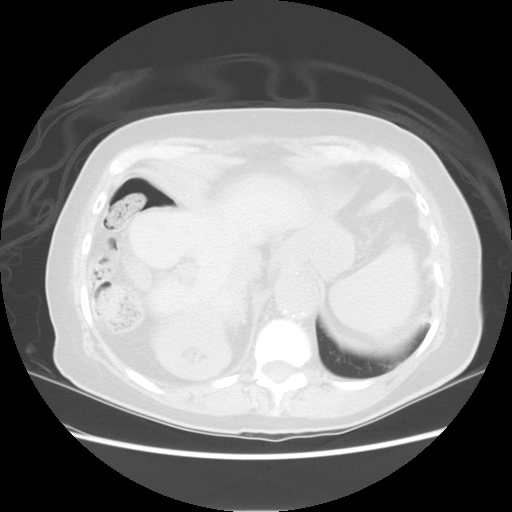
[im 23/58  lung]
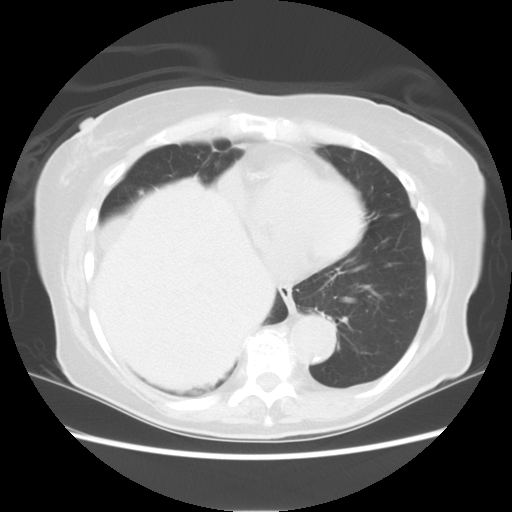
[im 35/58  lung]
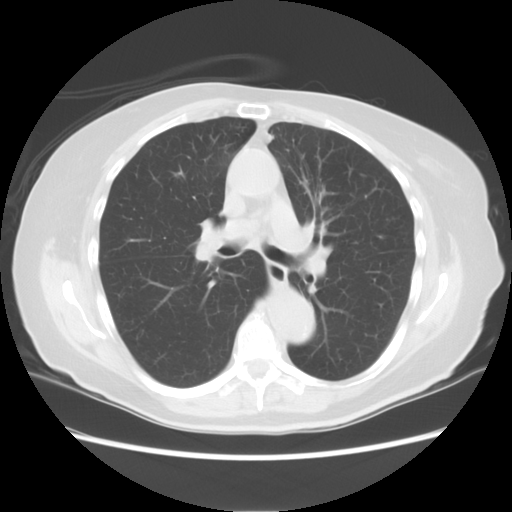
[im 46/58  lung]
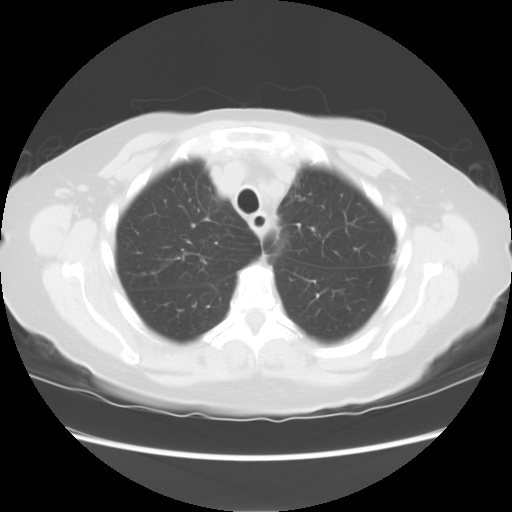

[Series 4: lung windows · axial · 0.70mm/px · z∈[-171,-1]mm · 4 of 58 slices shown]
[im 12/58  lung]
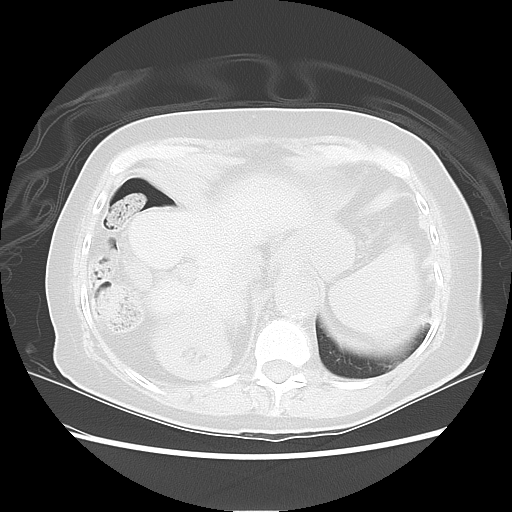
[im 23/58  lung]
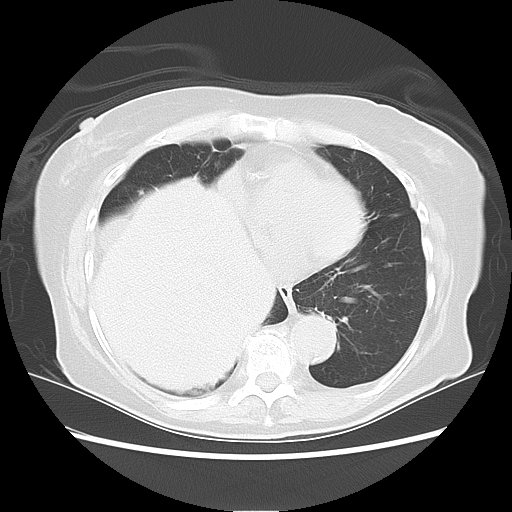
[im 35/58  lung]
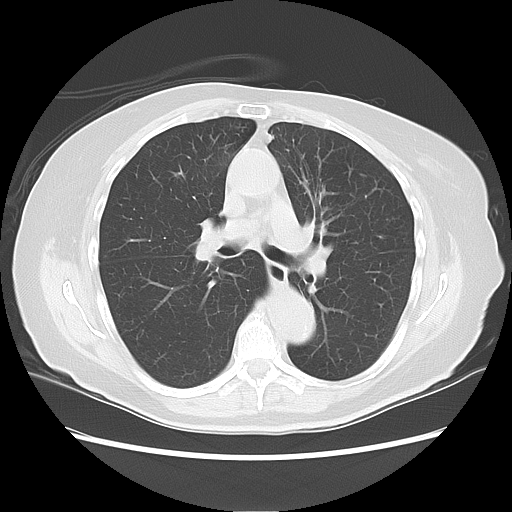
[im 46/58  lung]
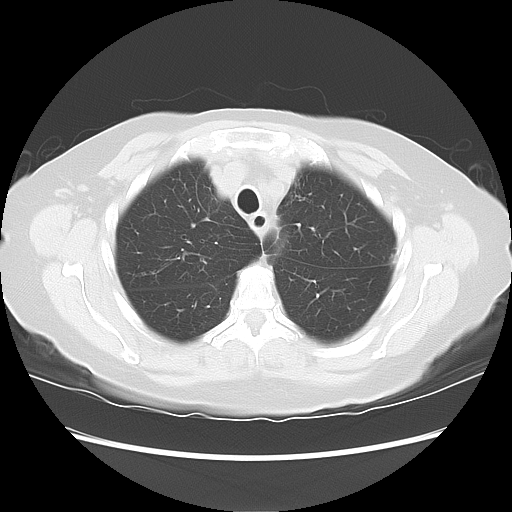

[Series 602: sagittal body · sagittal · 0.70mm/px · 8 of 145 slices shown]
[im 10/145  mediastinal]
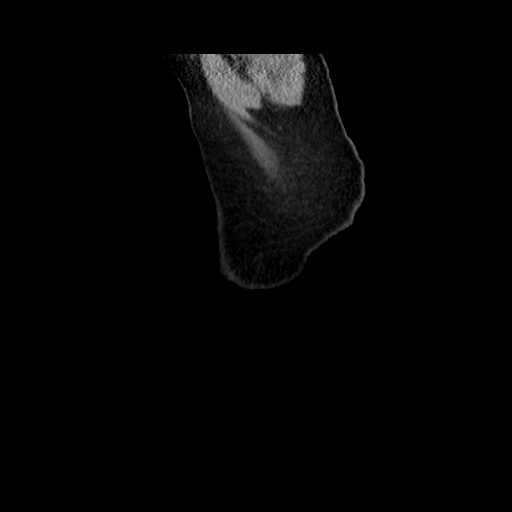
[im 29/145  mediastinal]
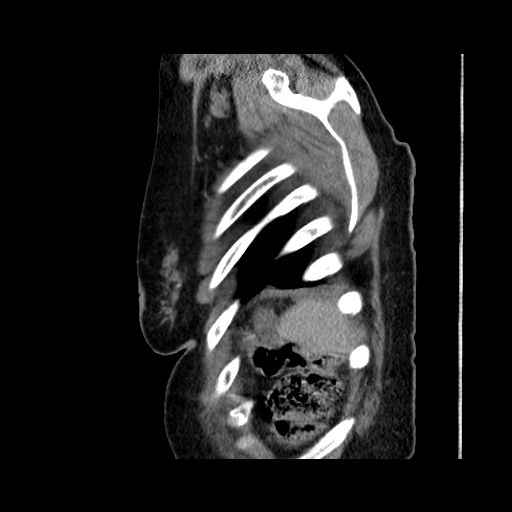
[im 49/145  mediastinal]
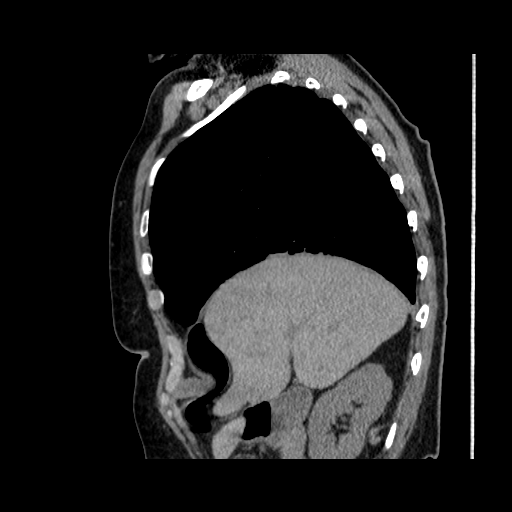
[im 68/145  mediastinal]
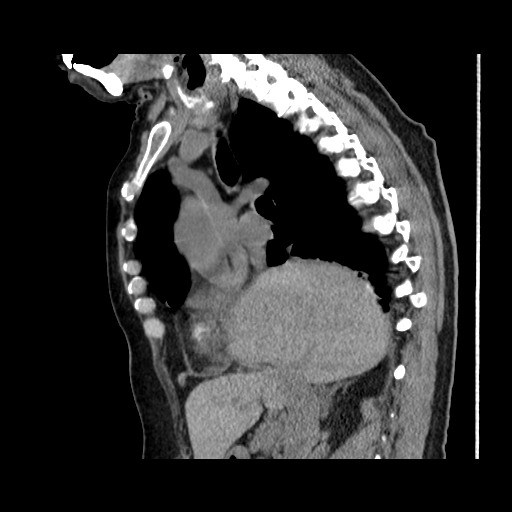
[im 77/145  mediastinal]
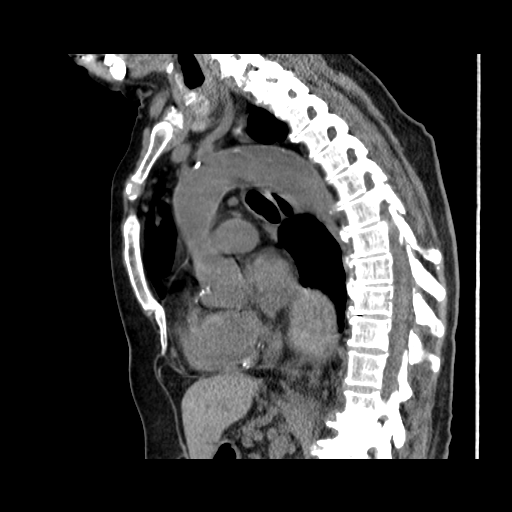
[im 97/145  mediastinal]
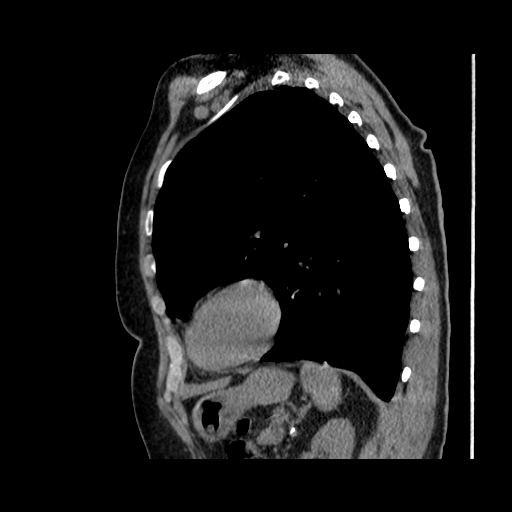
[im 116/145  mediastinal]
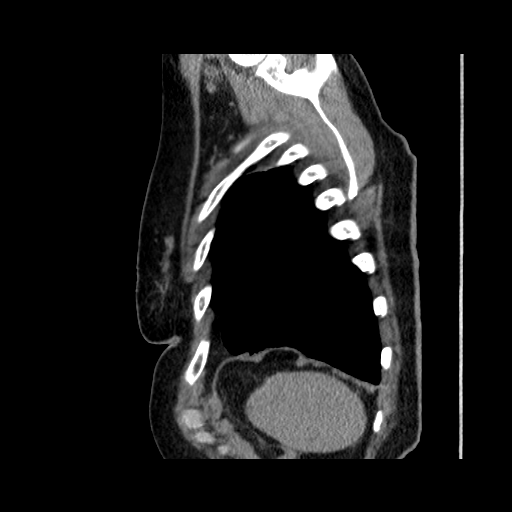
[im 135/145  mediastinal]
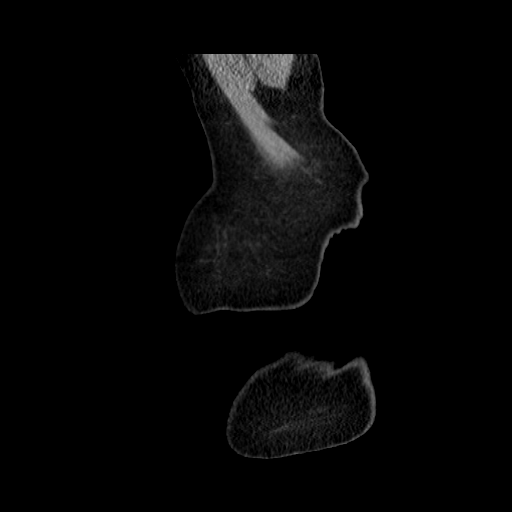

[16 of 30 positions shown; findings below may reference images not displayed]

FINDINGS: Mediastinum: Unremarkable thyroid gland and thoracic inlet.  No
suspicious mediastinal or hilar adenopathy.  Unremarkable thoracic
esophagus.

Heart/Vascular: Significantly limited evaluation the absence of
intravenous contrast.  Unchanged atherosclerotic, tortuous and
ectatic thoracic aorta with borderline aneurysmal dilatation at the
aortic hiatus.  The aorta measures 3.2 cm in maximal diameter at
the hiatus.

Lungs/Pleura: Unchanged chronic elevation of the right
hemidiaphragm is similar to incrementally enlarged cavitary left
upper lobe pulmonary nodule which measures 19 x 9 mm in greatest
dimension compared to 19 x 7 mm on 06/24/2011.   Stable 7 mm left
lower lobe pulmonary nodule. Stable 3 and 4 mm right upper,
superior segment right lower and left lower half of the 10 lobe
pulmonary nodules.  Additional scattered two - 3 mm calcified
pulmonary nodules throughout the lungs, unchanged.  Mild biapical
pleural parenchymal scarring.

Upper Abdomen: Relative hyperattenuation the hepatic parenchyma
relative to the spleen.  Otherwise, unremarkable noncontrast CT
appearance of the upper abdomen.

Bones: No acute fracture or aggressive appearing lytic or blastic
osseous lesion.
IMPRESSION: 1.  Stable to incrementally enlarged cavitary left upper lobe
pulmonary nodule compared to 06/24/2011.  Stable hypermetabolic
nodule in the superior segment of the left lower lobe.  Additional
scattered calcified and noncalcified pulmonary nodules are also
unchanged.
2.  Chronic elevation right hemidiaphragm
3.  Atherosclerosis with ectatic, bordering on aneurysmally dilated
distal thoracic aorta.
4.  Dense hepatic parenchyma.  Differential considerations include
chronic amiodarone use, and frequent blood transfusions.

## 2014-07-31 ENCOUNTER — Other Ambulatory Visit: Payer: Self-pay
